# Patient Record
Sex: Female | Born: 1950 | Race: Black or African American | Hispanic: No | Marital: Single | Smoking: Never smoker
Health system: Southern US, Community
[De-identification: ages and names within clinical notes are randomized; demographics above are authoritative.]

## PROBLEM LIST (undated history)

## (undated) DIAGNOSIS — R112 Nausea with vomiting, unspecified: Secondary | ICD-10-CM

## (undated) DIAGNOSIS — Z9889 Other specified postprocedural states: Secondary | ICD-10-CM

## (undated) DIAGNOSIS — F40240 Claustrophobia: Secondary | ICD-10-CM

## (undated) DIAGNOSIS — M1990 Unspecified osteoarthritis, unspecified site: Secondary | ICD-10-CM

## (undated) DIAGNOSIS — J45909 Unspecified asthma, uncomplicated: Secondary | ICD-10-CM

## (undated) DIAGNOSIS — I10 Essential (primary) hypertension: Secondary | ICD-10-CM

## (undated) DIAGNOSIS — G4730 Sleep apnea, unspecified: Secondary | ICD-10-CM

## (undated) HISTORY — DX: Essential (primary) hypertension: I10

## (undated) HISTORY — DX: Unspecified osteoarthritis, unspecified site: M19.90

## (undated) HISTORY — PX: JOINT REPLACEMENT: SHX530

## (undated) HISTORY — PX: LAPAROSCOPIC GASTRIC BANDING: SHX1100

## (undated) HISTORY — DX: Unspecified asthma, uncomplicated: J45.909

## (undated) HISTORY — PX: ABDOMINAL HYSTERECTOMY: SHX81

---

## 2004-11-03 ENCOUNTER — Other Ambulatory Visit: Admission: RE | Admit: 2004-11-03 | Discharge: 2004-11-03 | Payer: Self-pay | Admitting: Obstetrics and Gynecology

## 2005-05-29 ENCOUNTER — Ambulatory Visit (HOSPITAL_COMMUNITY): Admission: RE | Admit: 2005-05-29 | Discharge: 2005-05-29 | Payer: Self-pay | Admitting: Gastroenterology

## 2005-11-10 ENCOUNTER — Other Ambulatory Visit: Admission: RE | Admit: 2005-11-10 | Discharge: 2005-11-10 | Payer: Self-pay | Admitting: Obstetrics and Gynecology

## 2005-11-27 ENCOUNTER — Encounter: Admission: RE | Admit: 2005-11-27 | Discharge: 2005-11-27 | Payer: Self-pay | Admitting: Obstetrics and Gynecology

## 2005-11-28 ENCOUNTER — Ambulatory Visit (HOSPITAL_BASED_OUTPATIENT_CLINIC_OR_DEPARTMENT_OTHER): Admission: RE | Admit: 2005-11-28 | Discharge: 2005-11-28 | Payer: Self-pay | Admitting: *Deleted

## 2005-12-06 ENCOUNTER — Ambulatory Visit: Payer: Self-pay | Admitting: Internal Medicine

## 2006-12-24 ENCOUNTER — Encounter: Admission: RE | Admit: 2006-12-24 | Discharge: 2006-12-24 | Payer: Self-pay | Admitting: *Deleted

## 2012-10-17 ENCOUNTER — Ambulatory Visit (INDEPENDENT_AMBULATORY_CARE_PROVIDER_SITE_OTHER): Payer: BC Managed Care – PPO | Admitting: Surgery

## 2012-10-17 ENCOUNTER — Other Ambulatory Visit (INDEPENDENT_AMBULATORY_CARE_PROVIDER_SITE_OTHER): Payer: Self-pay | Admitting: General Surgery

## 2012-10-17 ENCOUNTER — Encounter (INDEPENDENT_AMBULATORY_CARE_PROVIDER_SITE_OTHER): Payer: Self-pay | Admitting: Surgery

## 2012-10-17 VITALS — BP 132/64 | HR 72 | Temp 97.2°F | Resp 16 | Ht 68.0 in | Wt 250.8 lb

## 2012-10-17 DIAGNOSIS — I10 Essential (primary) hypertension: Secondary | ICD-10-CM | POA: Insufficient documentation

## 2012-10-17 DIAGNOSIS — Z9884 Bariatric surgery status: Secondary | ICD-10-CM

## 2012-10-17 DIAGNOSIS — Z6838 Body mass index (BMI) 38.0-38.9, adult: Secondary | ICD-10-CM

## 2012-10-17 NOTE — Progress Notes (Signed)
Transfer of care note.  [name] moved here from [facility] and had a lap band APS system in August of 2010. Since that time she has lost 50 pounds. At times according for nodes she has been too tight and I think may have developed some maladaptive  eating problems.  I have talked to her about using a small fork and small plate to slow down her eating rate and diminish her quantity of intake.  I would like for her to see Huntley Dec Himmelrich for a dietary consult.   She would like to be below 200lbs and I think that that is within her reach.    Return 6 weeks

## 2012-10-17 NOTE — Patient Instructions (Addendum)
Gastric Banding Surgery Care After Refer to this sheet in the next few weeks. These discharge instructions provide you with general information on caring for yourself after you leave the hospital. Your caregiver may also give you specific instructions. Your treatment has been planned according to the most current medical practices available, but unavoidable complications sometimes occur. If you have any problems or questions after discharge, call your caregiver. HOME CARE INSTRUCTIONS  Activity  Take frequent walks throughout the day. This will help to prevent blood clots. Do not sit for longer than 45 minutes to 1 hour while awake for 4 to 6 weeks after surgery.  Continue to do coughing and deep breathing exercises once you get home. This will help to prevent pneumonia.  Do not do strenuous activities, such as heavy lifting, pushing, or pulling, until after your follow-up visit with your caregiver. Do not lift anything heavier than 10 lb (4.5 kg).  Talk with your caregiver about when you may return to work and your exercise routine.  Do not drive while taking prescription pain medicine. Nutrition  It is very important that you drink at least 80 oz (2,400 mL) of fluid a day.  You should stay on a clear liquid diet until your follow-up visit with your caregiver. Keep sugar-free, clear liquid items on hand, including:  Tea: hot or cold. Drink only decaffeinated for the first month.  Broths: clear beef, chicken, vegetable.  Others: water, sugar-free frozen ice pops, flavored water, gelatin (after 1 week).  Do not consume caffeine for 1 month. Large amounts of caffeine can cause dehydration.  A dietician may also give you specific instructions.  Follow your caregiver's recommendations about vitamins and protein requirements after surgery. Hygiene  You may shower and wash your hair 2 days after surgery. Pat incisions dry. Do not rub incisions with a washcloth or towel.  Follow your  caregiver's recommendations about baths and pools following surgery. Pain control  If a prescription medicine was given, follow your caregiver's directions.  You may feel some gas pain caused by the carbon dioxide used to inflate your abdomen during surgery. This pain can be felt in your chest, shoulder, back, or abdominal area. Moving around often is advised. Incision care You may have 4 or more small incisions. They are closed with skin adhesive strips and have a clear plastic covering over them. You may remove your dressings the number of days directed by your caregiver after surgery. Check your incisions and surrounding area daily for any redness, swelling, discoloration, fluid (drainage), or bleeding. Dark red, dried blood may appear under these coverings. This is normal. SEEK MEDICAL CARE IF:   You develop persistent nausea and vomiting.  You have pain and discomfort with swallowing.  You have pain, swelling, or warmth in the lower extremities.  You have an oral temperature above 102 F (38.9 C).  You develop chills.  Your incision sites look red, swollen, or have drainage.  Your stool is black, tarry, or maroon in color.  You are lightheaded when standing.  You have any questions or concerns. SEEK IMMEDIATE MEDICAL CARE IF:   You have chest pain.  You have severe calf pain or pain not relieved by medicine.  You develop shortness of breath or difficulty breathing.  You feel confused.  You have slurred speech.  You suddenly feel weak. MAKE SURE YOU:   Understand these instructions.  Will watch your condition.  Will get help right away if you are not doing well or get   worse. Document Released: 07/21/2004 Document Revised: 02/29/2012 Document Reviewed: 04/29/2010 ExitCare Patient Information 2013 [employer].  

## 2012-10-18 ENCOUNTER — Encounter: Payer: Self-pay | Admitting: *Deleted

## 2012-10-18 ENCOUNTER — Encounter: Payer: BC Managed Care – PPO | Attending: Surgery | Admitting: *Deleted

## 2012-10-18 VITALS — Ht 68.0 in | Wt 252.0 lb

## 2012-10-18 DIAGNOSIS — Z9884 Bariatric surgery status: Secondary | ICD-10-CM | POA: Insufficient documentation

## 2012-10-18 DIAGNOSIS — Z09 Encounter for follow-up examination after completed treatment for conditions other than malignant neoplasm: Secondary | ICD-10-CM | POA: Insufficient documentation

## 2012-10-18 DIAGNOSIS — E669 Obesity, unspecified: Secondary | ICD-10-CM

## 2012-10-18 DIAGNOSIS — Z713 Dietary counseling and surveillance: Secondary | ICD-10-CM | POA: Insufficient documentation

## 2012-10-18 NOTE — Patient Instructions (Addendum)
Goals:  Follow up with Dr. Daphine Deutscher regarding band fill    Choose high protein foods and non-starchy vegetables  For now, AVOID fruit, starches (ex: rice, pasta, breads), and starchy vegetables  Eat 3-6 small meals/snacks, every 3-5 hrs  Try protein shake at breakfast - [place] in coffee, Premier Protein shake, etc  Increase lean protein foods to meet 60-80g goal  Increase fluid intake to 64oz +  Avoid drinking 15 minutes before, during and 30 minutes after eating  Aim for >30 min of physical activity daily   Join [employer] and start water aerobics/water walking at least 3 days/week; gradually increase

## 2012-10-18 NOTE — Progress Notes (Addendum)
  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  Primary concerns today: Post-operative Bariatric Surgery Nutrition Management. [name] comes today to establish post-op LAGB care. She had surg in 07/2009 up in [place] and recently moved to [facility] (08/21/12). Reports a lowest weight of 240 lbs (01/13).  Has issues with getting to the green zone; she is either in the red or yellow. When fluid added, she regurgitates and when removed, she feels she has little restriction. The frustration has caused her to turn to comfort foods like rice cakes, etc. Is also not exercising or taking supplements consistently. Low protein intake noted as well.  Recommend she receive a fill at next visit while we work on changing eating habits. Needs lab work done to R/O any nutritional deficiencies.   Surgery date:  07/2009 Patient reported start weight:  301 lbs  Weight today: 252.0 lbs Weight change: 49.0 lbs Total weight lost: 49.0 lbs BMI: 38.3 kg/m^2  TANITA  BODY COMP RESULTS  10/18/12   %Fat 52.5%   Fat Mass (lbs) 132.5   Fat Free Mass (lbs) 119.5   Total Body Water (lbs) 87.5   Dietary Intake:  Reports she SKIPS breakfast daily; has only coffee with liquid creamer.  Eating "comfort foods" including rice cakes. Also eats egg, tuna, or chicken salad in cafeteria at work; likely made with high fat mayo. Cannot tolerate bread, fruit, and some meats. Recently started back with Premier and Atkins protein shakes/bars.    Fluid intake:  40-50 oz Estimated total protein intake: <60g  Medications: HCTZ, Lisinopril Supplementation:  Only takes MVI when she can remember;  No calcium at this time. Gave several samples and handout with info.   Using straws: No Drinking while eating: No Hair loss: No Carbonated beverages: No N/V/D/C:  Regurgitation and burping before last fluid removal on 7/13.  Last Lap-Band fill:  None since moved to [place]  Recent  physical activity:  None at this time. Discussed joining [employer] and doing water aerobics, which has worked well for her in the past.   Progress Towards Goal(s):  In progress.  Handouts given during visit include:  Bariatric Surgery Protein Shakes  [facility] Price List  Vitamin/Mineral Supplementation handout  [facility] Meal Planning Card (yellow)  Bariatric Surgery Non-Starchy Vegetable List   Nutritional Diagnosis:  [place]-3.4 Unintentional weight gain related to poor food choices after partial band fluid removal evidenced by patient reported intake of high carb comfort foods and a weight gain of ~10 lbs.    Intervention:  Nutrition education/reinforcement.  Monitoring/Evaluation:  Dietary intake, exercise, lap band fills, and body weight. Follow up in 1 month.

## 2012-10-20 ENCOUNTER — Ambulatory Visit: Payer: Self-pay | Admitting: *Deleted

## 2012-11-09 ENCOUNTER — Ambulatory Visit: Payer: Self-pay | Admitting: *Deleted

## 2012-11-14 ENCOUNTER — Encounter: Payer: BC Managed Care – PPO | Attending: Surgery | Admitting: *Deleted

## 2012-11-14 ENCOUNTER — Encounter: Payer: Self-pay | Admitting: *Deleted

## 2012-11-14 VITALS — Ht 68.0 in | Wt 254.5 lb

## 2012-11-14 DIAGNOSIS — Z09 Encounter for follow-up examination after completed treatment for conditions other than malignant neoplasm: Secondary | ICD-10-CM | POA: Insufficient documentation

## 2012-11-14 DIAGNOSIS — E669 Obesity, unspecified: Secondary | ICD-10-CM

## 2012-11-14 DIAGNOSIS — Z713 Dietary counseling and surveillance: Secondary | ICD-10-CM | POA: Insufficient documentation

## 2012-11-14 DIAGNOSIS — Z9884 Bariatric surgery status: Secondary | ICD-10-CM | POA: Insufficient documentation

## 2012-11-14 NOTE — Progress Notes (Signed)
  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  Primary concerns today: Post-operative Bariatric Surgery Nutrition Management. [name] returns today for f/u. Has gained 2 lbs of fluid (see Tanita data below) and maintained wt despite large increase in exercise and d/c of meal skipping. Reports she is "hungry all the time", though protein intake looks good. Fat content is high on varied days via excessive intake of nuts. No longer eating rice cakes and other comfort foods. [name] is likely in need of a band fill, but defer to Dr. Ross Marcus. Advised her to contact [facility] for appt.     Surgery date:  07/2009 Patient reported start weight:  301 lbs  Weight today: 254.5 lbs Weight change: 2.5 lb GAIN Total weight lost: 46.5 lbs BMI: 38.7 kg/m^2  TANITA  BODY COMP RESULTS  10/18/12 11/14/12   %Fat 52.5% 52.1%   Fat Mass (lbs) 132.5 132.5   Fat Free Mass (lbs) 119.5 122.0   Total Body Water (lbs) 87.5 89.5   Dietary Intake:   *Coffee  B: Egg and pc of ham, Premier protein bar, OR shake S: Atkins bar L: 1-2 oz [place] & cheese, 1/2 pc bacon, premier protein bar S: 2-3 oz nuts (varies from 1-3 oz; not every day) D: 1.5 cup of soup w/ 2 oz ground [place] and vegetables OR 3-4 oz baked breaded fish S: 2-3 oz nuts  Fluid intake:  1x/wk 1/2 sweet/unsweet tea, water 11 oz shake, 1-3 (32 oz) cups water Estimated total protein intake:   Medications: HCTZ, Lisinopril Supplementation:   Taking MVI and calcium regularly  Using straws: No Drinking while eating: No Hair loss: No Carbonated beverages: No N/V/D/C:  None Last Lap-Band fill:  None since moved to [place] - previous MD removed fluid in 06/2012; unsure how much is in her band  Recent physical activity:  Rides recumbent bike 4-5 times/week for 30-45 min since last visit.   Progress Towards Goal(s):  In progress.   Nutritional Diagnosis:  [place]-3.4 Unintentional  weight gain related to poor food choices after partial band fluid removal evidenced by patient reported intake of high carb comfort foods and a weight gain of ~10 lbs.    Intervention:  Nutrition education/reinforcement.  Monitoring/Evaluation:  Dietary intake, exercise, lap band fills, and body weight. Follow up after band fill.

## 2012-11-14 NOTE — Patient Instructions (Addendum)
Goals:  Follow up with Dr. Ross Marcus regarding band fill     Choose high protein foods and non-starchy vegetables  For now, AVOID fruit, starches (ex: rice, pasta, breads), and starchy vegetables  Increase lean protein foods to meet 60-80g goal - aim for 2-3 oz protein per meal  Increase fluid intake to 64oz +  Aim for >30 min of physical activity daily   Watch portions of nuts - limit to 1-2 oz daily (High fat content)  TANITA  BODY COMP RESULTS  10/18/12 11/14/12   %Fat 52.5% 52.1%   Fat Mass (lbs) 132.5 132.5   Fat Free Mass (lbs) 119.5 122.0   Total Body Water (lbs) 87.5 89.5

## 2012-12-02 ENCOUNTER — Ambulatory Visit (INDEPENDENT_AMBULATORY_CARE_PROVIDER_SITE_OTHER): Payer: BC Managed Care – PPO | Admitting: Surgery

## 2012-12-02 ENCOUNTER — Encounter (INDEPENDENT_AMBULATORY_CARE_PROVIDER_SITE_OTHER): Payer: Self-pay | Admitting: Surgery

## 2012-12-02 VITALS — BP 142/70 | HR 68 | Temp 97.6°F | Resp 12 | Ht 68.0 in | Wt 254.0 lb

## 2012-12-02 DIAGNOSIS — Z9884 Bariatric surgery status: Secondary | ICD-10-CM

## 2012-12-02 NOTE — Progress Notes (Signed)
Tonette Bihari Body mass index is 38.62 kg/(m^2).  Having regurgitation:  no  Nocturnal reflux?  no  Amount of fill  0.5 Her port is on the left side and I was able to palpated although it is very deep. Access this or lateral. I accessed it on the first stick and added 0.5 cc to her band. I want him to add incrementally. She was concerned about large fills and in the past they've had difficulty we accessing her port. I plan to access her and had incrementally. I'll see her again in 4 weeks to consider her for another band fill. She's currently recovering from gum surgery and is on a liquid diet anyway. I encouraged her stay on a full liquid diet including protein shakes. Return in 4 weeks

## 2012-12-02 NOTE — Patient Instructions (Signed)

## 2013-01-04 ENCOUNTER — Encounter (INDEPENDENT_AMBULATORY_CARE_PROVIDER_SITE_OTHER): Payer: Self-pay | Admitting: Surgery

## 2013-01-04 ENCOUNTER — Ambulatory Visit (INDEPENDENT_AMBULATORY_CARE_PROVIDER_SITE_OTHER): Payer: BC Managed Care – PPO | Admitting: Surgery

## 2013-01-04 VITALS — BP 124/84 | HR 72 | Temp 97.4°F | Resp 12 | Ht 68.0 in | Wt 246.2 lb

## 2013-01-04 DIAGNOSIS — Z9884 Bariatric surgery status: Secondary | ICD-10-CM

## 2013-01-04 NOTE — Patient Instructions (Addendum)

## 2013-01-04 NOTE — Progress Notes (Signed)
[name] Body mass index is 37.43 kg/(m^2).  Having regurgitation:  Not really but when she drinks shake too fast she burps  Nocturnal reflux?  no  Amount of fill  None She says that she is tight especially when she drinks her protein shakes. I think she is learning portion control. She is not is hungry she was when she saw Huntley Dec.  She wants to hold off her band on them but does want some lab checked. We will go and check a bariatric panel on her. We will place her on the schedule to be seen again in 3 months  Today his weight is 246 which is down almost 8 pounds since her last visit on 12/02/12.

## 2013-01-09 LAB — COMPREHENSIVE METABOLIC PANEL
AST: 21 U/L (ref 0–37)
Albumin: 4.2 g/dL (ref 3.5–5.2)
Alkaline Phosphatase: 86 U/L (ref 39–117)
BUN: 17 mg/dL (ref 6–23)
Calcium: 10 mg/dL (ref 8.4–10.5)
Chloride: 104 mEq/L (ref 96–112)
Creat: 0.74 mg/dL (ref 0.50–1.10)
Glucose, Bld: 93 mg/dL (ref 70–99)
Potassium: 3.9 mEq/L (ref 3.5–5.3)

## 2013-01-09 LAB — CBC WITH DIFFERENTIAL/PLATELET
Basophils Absolute: 0 10*3/uL (ref 0.0–0.1)
Basophils Relative: 0 % (ref 0–1)
Eosinophils Relative: 2 % (ref 0–5)
HCT: 37.1 % (ref 36.0–46.0)
Hemoglobin: 12.6 g/dL (ref 12.0–15.0)
MCH: 30.4 pg (ref 26.0–34.0)
MCHC: 34 g/dL (ref 30.0–36.0)
MCV: 89.4 fL (ref 78.0–100.0)
Monocytes Absolute: 0.3 10*3/uL (ref 0.1–1.0)
Monocytes Relative: 5 % (ref 3–12)
Neutro Abs: 3.1 10*3/uL (ref 1.7–7.7)
RDW: 15.3 % (ref 11.5–15.5)

## 2013-01-09 LAB — LIPID PANEL
Cholesterol: 204 mg/dL — ABNORMAL HIGH (ref 0–200)
HDL: 44 mg/dL (ref >39)
LDL Cholesterol: 137 mg/dL — ABNORMAL HIGH (ref 0–99)
Triglycerides: 115 mg/dL (ref <150)

## 2013-01-09 LAB — IBC PANEL
%SAT: 32 % (ref 20–55)
TIBC: 314 ug/dL (ref 250–470)
UIBC: 215 ug/dL (ref 125–400)

## 2013-01-10 LAB — VITAMIN D 25 HYDROXY (VIT D DEFICIENCY, FRACTURES): Vit D, 25-Hydroxy: 48 ng/mL (ref 30–89)

## 2013-01-10 LAB — H. PYLORI ANTIBODY, IGG: H Pylori IgG: 1.17 {ISR} — ABNORMAL HIGH

## 2013-01-12 ENCOUNTER — Ambulatory Visit
Admission: RE | Admit: 2013-01-12 | Discharge: 2013-01-12 | Disposition: A | Discharge status: Home / Self Care | Payer: BC Managed Care – PPO | Source: Ambulatory Visit | Attending: Physician Assistant | Admitting: Physician Assistant

## 2013-01-12 ENCOUNTER — Encounter (INDEPENDENT_AMBULATORY_CARE_PROVIDER_SITE_OTHER): Payer: Self-pay | Admitting: Physician Assistant

## 2013-01-12 ENCOUNTER — Telehealth (INDEPENDENT_AMBULATORY_CARE_PROVIDER_SITE_OTHER): Payer: Self-pay | Admitting: Physician Assistant

## 2013-01-12 ENCOUNTER — Ambulatory Visit (INDEPENDENT_AMBULATORY_CARE_PROVIDER_SITE_OTHER): Payer: BC Managed Care – PPO | Admitting: Physician Assistant

## 2013-01-12 VITALS — BP 122/82 | HR 79 | Temp 96.8°F | Resp 16 | Ht 68.0 in | Wt 248.0 lb

## 2013-01-12 DIAGNOSIS — Z4651 Encounter for fitting and adjustment of gastric lap band: Secondary | ICD-10-CM

## 2013-01-12 NOTE — Progress Notes (Signed)
  HISTORY: [name] is a 62 y.o.female who received an AP-Standard lap-band in 2010 in [place]. She was seen by Dr. Daphine Deutscher last week with no adjustment. She had some complaints of burping but no persistent dysphagia. Now she has significant burping and solid food dysphagia.  VITAL SIGNS: Filed Vitals:   01/12/13 1257  BP: 122/82  Pulse: 79  Temp: 96.8 F (36 C)  Resp: 16    PHYSICAL EXAM: Physical exam reveals a very well-appearing 62 y.o.female in no apparent distress Neurologic: Awake, alert, oriented Psych: Bright affect, conversant Respiratory: Breathing even and unlabored. No stridor or wheezing Abdomen: Soft, nontender, nondistended to palpation. Incisions well-healed. No incisional hernias. Port easily palpated. Extremities: Atraumatic, good range of motion.  ASSESMENT: 62 y.o.  female  s/p AP-Standard lap-band.   PLAN: The patient's port was accessed with a 20G Huber needle without difficulty. Clear fluid was aspirated and 0.5 mL saline was removed from the port. The patient was able to swallow water without difficulty following the procedure and was instructed to take clear liquids for the next 24-48 hours and advance slowly as tolerated. I've ordered a KUB as well.

## 2013-01-12 NOTE — Patient Instructions (Addendum)
Get your xray today. We will call you with the results and follow-up instructions.

## 2013-01-12 NOTE — Telephone Encounter (Signed)
Called Re: KUB results today. Left message that 'test results looked great'. Told patient to expect a call from [place] for an appointment in February. Asked pt to call if any questions.

## 2013-01-13 ENCOUNTER — Telehealth (INDEPENDENT_AMBULATORY_CARE_PROVIDER_SITE_OTHER): Payer: Self-pay | Admitting: General Surgery

## 2013-01-13 NOTE — Telephone Encounter (Signed)
LMOM to set up lap band appt in Feb per [place]...ask patient to return my call

## 2013-02-16 ENCOUNTER — Ambulatory Visit (INDEPENDENT_AMBULATORY_CARE_PROVIDER_SITE_OTHER): Payer: BC Managed Care – PPO | Admitting: Physician Assistant

## 2013-02-16 ENCOUNTER — Encounter (INDEPENDENT_AMBULATORY_CARE_PROVIDER_SITE_OTHER): Payer: Self-pay

## 2013-02-16 VITALS — BP 142/90 | HR 84 | Temp 97.6°F | Resp 16 | Ht 68.0 in | Wt 256.8 lb

## 2013-02-16 DIAGNOSIS — Z4651 Encounter for fitting and adjustment of gastric lap band: Secondary | ICD-10-CM

## 2013-02-16 NOTE — Patient Instructions (Signed)
Take clear liquids tonight. Thin protein shakes are ok to start tomorrow morning. Slowly advance your diet thereafter. Call us if you have persistent vomiting or regurgitation, night cough or reflux symptoms. Return as scheduled or sooner if you notice no changes in hunger/portion sizes.  

## 2013-02-16 NOTE — Progress Notes (Signed)
  HISTORY: [name] is a 62 y.o.female who received an AP-Standard lap-band in August 2010. She comes in with 10 lbs weight gain and increased hunger and portion sizes since fluid was removed for solid food dysphagia and burping in January. She denies further issues with burping or regurgitation and she would like to have a fill.  VITAL SIGNS: Filed Vitals:   02/16/13 1545  BP: 142/90  Pulse: 84  Temp: 97.6 F (36.4 C)  Resp: 16    PHYSICAL EXAM: Physical exam reveals a very well-appearing 62 y.o.female in no apparent distress Neurologic: Awake, alert, oriented Psych: Bright affect, conversant Respiratory: Breathing even and unlabored. No stridor or wheezing Abdomen: Soft, nontender, nondistended to palpation. Incisions well-healed. No incisional hernias. Port easily palpated. Extremities: Atraumatic, good range of motion.  ASSESMENT: 62 y.o.  female  s/p AP-Standard  lap-band.   PLAN: The patient's port was accessed with a 20G Huber needle without difficulty. Clear fluid was aspirated and 0.25 mL saline was added to the port. The patient was able to swallow water without difficulty following the procedure and was instructed to take clear liquids for the next 24-48 hours and advance slowly as tolerated.

## 2013-02-21 ENCOUNTER — Telehealth (INDEPENDENT_AMBULATORY_CARE_PROVIDER_SITE_OTHER): Payer: Self-pay | Admitting: General Surgery

## 2013-02-21 NOTE — Telephone Encounter (Signed)
Pt called to report she had a lap band fill last week and cannot tell any difference.  She is "drinking tons" of fluid, but remains hungry.  She would like to come back in on Thursday for more fill.  She is concerned about having to make another co-pay so soon.  Pt understands that the charges for the visit will be at the PAs discretion.  Appt made.

## 2013-02-23 ENCOUNTER — Encounter (INDEPENDENT_AMBULATORY_CARE_PROVIDER_SITE_OTHER): Payer: BC Managed Care – PPO

## 2013-03-16 ENCOUNTER — Ambulatory Visit (INDEPENDENT_AMBULATORY_CARE_PROVIDER_SITE_OTHER): Payer: BC Managed Care – PPO | Admitting: Physician Assistant

## 2013-03-16 ENCOUNTER — Encounter (INDEPENDENT_AMBULATORY_CARE_PROVIDER_SITE_OTHER): Payer: Self-pay

## 2013-03-16 ENCOUNTER — Encounter (INDEPENDENT_AMBULATORY_CARE_PROVIDER_SITE_OTHER): Payer: BC Managed Care – PPO

## 2013-03-16 VITALS — BP 132/72 | HR 88 | Temp 97.7°F | Resp 16 | Ht 68.0 in | Wt 259.2 lb

## 2013-03-16 DIAGNOSIS — Z4651 Encounter for fitting and adjustment of gastric lap band: Secondary | ICD-10-CM

## 2013-03-16 NOTE — Progress Notes (Signed)
  HISTORY: [name] is a 62 y.o.female who received lap-band in August 2010. She comes in with continued hunger, weight gain and larger portion sizes despite a fill one month ago. She would like a fill today. She's has no complaints of regurgitation or reflux.  VITAL SIGNS: Filed Vitals:   03/16/13 1421  BP: 132/72  Pulse: 88  Temp: 97.7 F (36.5 C)  Resp: 16    PHYSICAL EXAM: Physical exam reveals a very well-appearing 62 y.o.female in no apparent distress Neurologic: Awake, alert, oriented Psych: Bright affect, conversant Respiratory: Breathing even and unlabored. No stridor or wheezing Abdomen: Soft, nontender, nondistended to palpation. Incisions well-healed. No incisional hernias. Port easily palpated. Extremities: Atraumatic, good range of motion.  ASSESMENT: 62 y.o.  female  s/p lap-band.   PLAN: The patient's port was accessed with a 20G Huber needle without difficulty. Clear fluid was aspirated and 0.25 mL saline was added to the port. The patient was able to swallow water without difficulty following the procedure and was instructed to take clear liquids for the next 24-48 hours and advance slowly as tolerated.

## 2013-03-16 NOTE — Patient Instructions (Signed)
Take clear liquids tonight. Thin protein shakes are ok to start tomorrow morning. Slowly advance your diet thereafter. Call us if you have persistent vomiting or regurgitation, night cough or reflux symptoms. Return as scheduled or sooner if you notice no changes in hunger/portion sizes.  

## 2013-04-13 ENCOUNTER — Ambulatory Visit (INDEPENDENT_AMBULATORY_CARE_PROVIDER_SITE_OTHER): Payer: BC Managed Care – PPO | Admitting: Physician Assistant

## 2013-04-13 ENCOUNTER — Encounter (INDEPENDENT_AMBULATORY_CARE_PROVIDER_SITE_OTHER): Payer: Self-pay

## 2013-04-13 DIAGNOSIS — Z9884 Bariatric surgery status: Secondary | ICD-10-CM

## 2013-04-13 NOTE — Patient Instructions (Signed)
Return in June. Focus on good food choices as well as physical activity. Return sooner if you have an increase in hunger, portion sizes or weight. Return also for difficulty swallowing, night cough, reflux.

## 2013-04-13 NOTE — Progress Notes (Signed)
  HISTORY: [name] is a 62 y.o.female who received an AP-Standard lap-band in August 2010 in [place]. She comes in with 3 lbs weight loss. She describes hunger and poor satiety, mostly because of her primarily soft-food diet. She is expecting her partial denture to be fitted in one week, which will finally allow her to chew adequately. She's not capable of taking good proteins due to her dental issue.  VITAL SIGNS: Filed Vitals:   04/13/13 1515  BP: 130/76  Pulse: 80  Resp: 18    PHYSICAL EXAM: Physical exam reveals a very well-appearing 62 y.o.female in no apparent distress Neurologic: Awake, alert, oriented Psych: Bright affect, conversant Respiratory: Breathing even and unlabored. No stridor or wheezing Extremities: Atraumatic, good range of motion. Skin: Warm, Dry, no rashes Musculoskeletal: Normal gait, Joints normal  ASSESMENT: 62 y.o.  female  s/p AP-Standard lap-band.   PLAN: As she's taking soft food now and she's had issues with over-restriction in the past, we decided to defer an adjustment until she is again taking more solid food. We'll have her back in about six weeks.

## 2013-05-25 ENCOUNTER — Encounter (INDEPENDENT_AMBULATORY_CARE_PROVIDER_SITE_OTHER): Payer: Self-pay

## 2013-05-25 ENCOUNTER — Ambulatory Visit (INDEPENDENT_AMBULATORY_CARE_PROVIDER_SITE_OTHER): Payer: BC Managed Care – PPO | Admitting: Physician Assistant

## 2013-05-25 VITALS — BP 132/84 | HR 81 | Temp 98.3°F | Resp 18 | Ht 68.0 in | Wt 264.0 lb

## 2013-05-25 DIAGNOSIS — Z4651 Encounter for fitting and adjustment of gastric lap band: Secondary | ICD-10-CM

## 2013-05-25 NOTE — Progress Notes (Signed)
  HISTORY: [name] is a 62 y.o.female who received an AP-Standard lap-band in August 2010 in [place]. She comes in with 8 lbs weight gain since her last visit. She is having significant hunger and larger portion sizes. She denies regurgitation. She would like a fill today to get her weight back under control.  VITAL SIGNS: Filed Vitals:   05/25/13 1618  BP: 132/84  Pulse: 81  Temp: 98.3 F (36.8 C)  Resp: 18    PHYSICAL EXAM: Physical exam reveals a very well-appearing 62 y.o.female in no apparent distress Neurologic: Awake, alert, oriented Psych: Bright affect, conversant Respiratory: Breathing even and unlabored. No stridor or wheezing Abdomen: Soft, nontender, nondistended to palpation. Incisions well-healed. No incisional hernias. Port easily palpated. Extremities: Atraumatic, good range of motion.  ASSESMENT: 62 y.o.  female  s/p AP-Standard lap-band.   PLAN: The patient's port was accessed with a 20G Huber needle without difficulty. Clear fluid was aspirated and 0.4 mL saline was added to the port. The patient was able to swallow water without difficulty following the procedure and was instructed to take clear liquids for the next 24-48 hours and advance slowly as tolerated.

## 2013-05-25 NOTE — Patient Instructions (Signed)
Take clear liquids tonight. Thin protein shakes are ok to start tomorrow morning. Slowly advance your diet thereafter. Call us if you have persistent vomiting or regurgitation, night cough or reflux symptoms. Return as scheduled or sooner if you notice no changes in hunger/portion sizes.  

## 2013-06-20 ENCOUNTER — Other Ambulatory Visit: Payer: Self-pay

## 2013-06-20 DIAGNOSIS — Z1231 Encounter for screening mammogram for malignant neoplasm of breast: Secondary | ICD-10-CM

## 2013-06-29 ENCOUNTER — Encounter (INDEPENDENT_AMBULATORY_CARE_PROVIDER_SITE_OTHER): Payer: Self-pay

## 2013-06-29 ENCOUNTER — Ambulatory Visit (INDEPENDENT_AMBULATORY_CARE_PROVIDER_SITE_OTHER): Payer: BC Managed Care – PPO | Admitting: Physician Assistant

## 2013-06-29 DIAGNOSIS — Z4651 Encounter for fitting and adjustment of gastric lap band: Secondary | ICD-10-CM

## 2013-06-29 DIAGNOSIS — R142 Eructation: Secondary | ICD-10-CM

## 2013-06-29 NOTE — Patient Instructions (Signed)
Obtain your x-ray. Return in two weeks. Focus on good food choices as well as physical activity. Return sooner if you have an increase in hunger, portion sizes or weight. Return also for difficulty swallowing, night cough, reflux.

## 2013-06-29 NOTE — Progress Notes (Signed)
  HISTORY: [name] is a 62 y.o.female who received an AP-Standard lap-band in August 2010 in [place]. She comes in with recurrent excessive belching and solid food dysphagia. She reports even water going down slowly.  VITAL SIGNS: Filed Vitals:   06/29/13 1532  BP: 130/80  Pulse: 76  Resp: 16    PHYSICAL EXAM: Physical exam reveals a very well-appearing 62 y.o.female in no apparent distress. She is belching audibly to the point that it's interrupting conversation. Neurologic: Awake, alert, oriented Psych: Bright affect, conversant Respiratory: Breathing even and unlabored. No stridor or wheezing Abdomen: Soft, nontender, nondistended to palpation. Incisions well-healed. No incisional hernias. Port easily palpated. Extremities: Atraumatic, good range of motion.  ASSESMENT: 62 y.o.  female  s/p AP-Standard lap-band.   PLAN: The patient's port was accessed with a 20G Huber needle without difficulty. Clear fluid was aspirated and 1 mL saline was removed from the port. The patient was advised to concentrate on healthy food choices and to avoid slider foods high in fats and carbohydrates. I've ordered an upper GI which is scheduled for tomorrow. I've asked her to return in two weeks. Meantime, I asked her to have liquids only today then progress to solids tomorrow after her study.

## 2013-06-30 ENCOUNTER — Ambulatory Visit
Admission: RE | Admit: 2013-06-30 | Discharge: 2013-06-30 | Disposition: A | Discharge status: Home / Self Care | Payer: BC Managed Care – PPO | Source: Ambulatory Visit | Attending: Physician Assistant | Admitting: Physician Assistant

## 2013-06-30 DIAGNOSIS — R142 Eructation: Secondary | ICD-10-CM

## 2013-06-30 DIAGNOSIS — Z4651 Encounter for fitting and adjustment of gastric lap band: Secondary | ICD-10-CM

## 2013-07-07 ENCOUNTER — Ambulatory Visit
Admission: RE | Admit: 2013-07-07 | Discharge: 2013-07-07 | Disposition: A | Discharge status: Home / Self Care | Payer: BC Managed Care – PPO | Source: Ambulatory Visit

## 2013-07-07 DIAGNOSIS — Z1231 Encounter for screening mammogram for malignant neoplasm of breast: Secondary | ICD-10-CM

## 2013-07-13 ENCOUNTER — Encounter (INDEPENDENT_AMBULATORY_CARE_PROVIDER_SITE_OTHER): Payer: Self-pay

## 2013-07-13 ENCOUNTER — Ambulatory Visit (INDEPENDENT_AMBULATORY_CARE_PROVIDER_SITE_OTHER): Payer: BC Managed Care – PPO | Admitting: Physician Assistant

## 2013-07-13 DIAGNOSIS — Z4651 Encounter for fitting and adjustment of gastric lap band: Secondary | ICD-10-CM

## 2013-07-13 NOTE — Progress Notes (Signed)
  HISTORY: [name] is a 62 y.o.female who received an AP-Standard lap-band in August 2010 in [place]. She had 1 mL fluid removed two weeks ago for persistent belching and dysphagia. She had an upper GI the following day. The fluid removal resolved her symptoms but she now complains of hunger and larger than desired portions. The upper GI showed good transit of contrast and the barium tablet. She has lost 1 lb since fluid removal but she is concerned about the hunger and getting it under control.  VITAL SIGNS: Filed Vitals:   07/13/13 1525  BP: 132/80  Pulse: 60  Resp: 16    PHYSICAL EXAM: Physical exam reveals a very well-appearing 62 y.o.female in no apparent distress Neurologic: Awake, alert, oriented Psych: Bright affect, conversant Respiratory: Breathing even and unlabored. No stridor or wheezing Abdomen: Soft, nontender, nondistended to palpation. Incisions well-healed. No incisional hernias. Port easily palpated. Extremities: Atraumatic, good range of motion.  ASSESMENT: 62 y.o.  female  s/p AP-Standard lap-band.   PLAN: The patient's port was accessed with a 20G Huber needle without difficulty. Clear fluid was aspirated and 0.5 mL saline was added to the port. The patient was able to swallow water without difficulty following the procedure and was instructed to take clear liquids for the next 24-48 hours and advance slowly as tolerated. We discussed the need to help her hunger but to avoid filling the band to the point of causing obstructive symptoms and the belching she had. I encouraged her to strive for a high-protein, low-carb diet along with exercise. She voiced understanding and agreement. We'll have her return in 4-6 weeks.

## 2013-07-13 NOTE — Patient Instructions (Signed)

## 2013-08-04 ENCOUNTER — Emergency Department (HOSPITAL_COMMUNITY): Payer: BC Managed Care – PPO

## 2013-08-04 ENCOUNTER — Observation Stay (HOSPITAL_COMMUNITY)
Admission: EM | Admit: 2013-08-04 | Discharge: 2013-08-05 | Disposition: A | Discharge status: Home / Self Care | Payer: BC Managed Care – PPO | Attending: Internal Medicine | Admitting: Internal Medicine

## 2013-08-04 ENCOUNTER — Encounter (HOSPITAL_COMMUNITY): Payer: Self-pay | Admitting: Emergency Medicine

## 2013-08-04 DIAGNOSIS — Z79899 Other long term (current) drug therapy: Secondary | ICD-10-CM | POA: Insufficient documentation

## 2013-08-04 DIAGNOSIS — E785 Hyperlipidemia, unspecified: Secondary | ICD-10-CM | POA: Diagnosis present

## 2013-08-04 DIAGNOSIS — E669 Obesity, unspecified: Secondary | ICD-10-CM | POA: Insufficient documentation

## 2013-08-04 DIAGNOSIS — R079 Chest pain, unspecified: Principal | ICD-10-CM | POA: Insufficient documentation

## 2013-08-04 DIAGNOSIS — Z8249 Family history of ischemic heart disease and other diseases of the circulatory system: Secondary | ICD-10-CM | POA: Insufficient documentation

## 2013-08-04 DIAGNOSIS — I10 Essential (primary) hypertension: Secondary | ICD-10-CM | POA: Insufficient documentation

## 2013-08-04 HISTORY — DX: Other specified postprocedural states: Z98.890

## 2013-08-04 HISTORY — DX: Other specified postprocedural states: R11.2

## 2013-08-04 LAB — BASIC METABOLIC PANEL
BUN: 15 mg/dL (ref 6–23)
Calcium: 9.3 mg/dL (ref 8.4–10.5)
Creatinine, Ser: 0.67 mg/dL (ref 0.50–1.10)
GFR calc non Af Amer: >90 mL/min (ref >90)

## 2013-08-04 LAB — CBC
HCT: 39.2 % (ref 36.0–46.0)
MCH: 30 pg (ref 26.0–34.0)
MCHC: 33.2 g/dL (ref 30.0–36.0)
MCV: 90.5 fL (ref 78.0–100.0)
Platelets: 274 10*3/uL (ref 150–400)
RDW: 13.4 % (ref 11.5–15.5)

## 2013-08-04 LAB — TROPONIN I
Troponin I: <0.3 ng/mL (ref <0.30)
Troponin I: <0.3 ng/mL (ref <0.30)

## 2013-08-04 LAB — D-DIMER, QUANTITATIVE: D-Dimer, Quant: 1.86 ug/mL-FEU — ABNORMAL HIGH (ref 0.00–0.48)

## 2013-08-04 MED ORDER — ENOXAPARIN SODIUM 40 MG/0.4ML ~~LOC~~ SOLN
40.0000 mg | SUBCUTANEOUS | Status: DC
  Administered 2013-08-04: 40 mg via SUBCUTANEOUS
  Filled 2013-08-04 (×2): qty 0.4

## 2013-08-04 MED ORDER — IOHEXOL 350 MG/ML SOLN
100.0000 mL | Freq: Once | INTRAVENOUS | Status: AC | PRN
  Administered 2013-08-04: 100 mL via INTRAVENOUS

## 2013-08-04 MED ORDER — ASPIRIN EC 81 MG PO TBEC
81.0000 mg | DELAYED_RELEASE_TABLET | Freq: Every day | ORAL | Status: DC
  Filled 2013-08-04: qty 1

## 2013-08-04 MED ORDER — NITROGLYCERIN 2 % TD OINT
0.5000 [in_us] | TOPICAL_OINTMENT | Freq: Four times a day (QID) | TRANSDERMAL | Status: DC
  Administered 2013-08-04 (×2): 0.5 [in_us] via TOPICAL
  Filled 2013-08-04: qty 30

## 2013-08-04 MED ORDER — ASPIRIN EC 81 MG PO TBEC
81.0000 mg | DELAYED_RELEASE_TABLET | Freq: Every day | ORAL | Status: DC
  Administered 2013-08-05: 81 mg via ORAL
  Filled 2013-08-04: qty 1

## 2013-08-04 MED ORDER — HYDROCHLOROTHIAZIDE 25 MG PO TABS
25.0000 mg | ORAL_TABLET | Freq: Every evening | ORAL | Status: DC
  Administered 2013-08-04: 25 mg via ORAL
  Filled 2013-08-04 (×2): qty 1

## 2013-08-04 MED ORDER — TRAZODONE HCL 100 MG PO TABS
100.0000 mg | ORAL_TABLET | Freq: Every day | ORAL | Status: DC
  Administered 2013-08-04: 100 mg via ORAL
  Filled 2013-08-04 (×2): qty 1

## 2013-08-04 MED ORDER — ASPIRIN 81 MG PO CHEW
324.0000 mg | CHEWABLE_TABLET | Freq: Once | ORAL | Status: AC
  Administered 2013-08-04: 324 mg via ORAL
  Filled 2013-08-04: qty 4

## 2013-08-04 MED ORDER — LISINOPRIL 20 MG PO TABS
30.0000 mg | ORAL_TABLET | Freq: Every evening | ORAL | Status: DC
  Administered 2013-08-04: 30 mg via ORAL
  Filled 2013-08-04 (×2): qty 1

## 2013-08-04 MED ORDER — SODIUM CHLORIDE 0.9 % IJ SOLN
3.0000 mL | Freq: Two times a day (BID) | INTRAMUSCULAR | Status: DC
  Administered 2013-08-04: 3 mL via INTRAVENOUS

## 2013-08-04 NOTE — ED Provider Notes (Signed)
CSN: [id number]     Arrival date & time 08/04/13  1236 History     First MD Initiated Contact with Patient 08/04/13 1255     Chief Complaint  Patient presents with  . Shortness of Breath   HPI  [name] is a 62 year old female with a PMH of HTN, HLD, asthma, and arthritis who presents to the ED for evaluation of SOB.  Patient states she has been having intermittent SOB and generalized weakness for the past week.  She states that her SOB is worse with exertion.  She also has SOB at rest.  Today at work around 9:00 am she developed a chest pressure diffusely across her chest and numbness and tingling in her left arm.  Her pressure is constant and currently present in the ED.  Nothing makes her symptoms better or worse.  She has never had similar symptoms in the past.  She also states she felt lightheaded with no syncope.  No lightheadedness currently.  She denies any heart history in the past.  No cocaine or tobacco use.  She states she had a normal stress test a few years ago.  She has had decreased appetite for the past week as well as fatigue.  No fever, chills, rhinorrhea, congestion, cough, abdominal pain, nausea, emesis, diarrhea, constipation, dysuria, or leg edema.  No FM or personal hx of clotting disorders/DVT. No recent travel or surgeries.  FH of cardiac disease.  Mom had a MI at age 45.     Past Medical History  Diagnosis Date  . Arthritis   . Hypertension   . Asthma    Past Surgical History  Procedure Laterality Date  . Abdominal hysterectomy    . Joint replacement      both knees  . Laparoscopic gastric banding     Family History  Problem Relation Age of Onset  . Stroke Mother   . Heart disease Mother   . Cancer Father     prostate  . Cancer Sister     colon   History  Substance Use Topics  . Smoking status: Never Smoker   . Smokeless tobacco: Never Used  . Alcohol Use: No   OB History   Grav Para Term Preterm Abortions TAB SAB Ect Mult Living                  Review of Systems  Constitutional: Positive for appetite change (decreased) and fatigue. Negative for fever, chills, diaphoresis and activity change.  HENT: Negative for ear pain, congestion, sore throat, neck pain and neck stiffness.   Eyes: Negative for visual disturbance.  Respiratory: Positive for shortness of breath. Negative for cough and wheezing.   Cardiovascular: Positive for chest pain (pressure). Negative for palpitations and leg swelling.  Gastrointestinal: Negative for nausea, vomiting, abdominal pain, diarrhea and constipation.  Genitourinary: Negative for dysuria.  Musculoskeletal: Negative for back pain.  Skin: Negative for rash and wound.  Neurological: Positive for weakness (generalized) and light-headedness. Negative for syncope and headaches.    Allergies  Morphine and related and Lipitor  Home Medications   Current Outpatient Rx  Name  Route  Sig  Dispense  Refill  . Calcium Carbonate-Vitamin D (CALCIUM 600 + D PO)   Oral   Take 2 tablets by mouth at bedtime.         . hydrochlorothiazide (HYDRODIURIL) 25 MG tablet   Oral   Take 25 mg by mouth every evening.          Marland Kitchen  lisinopril (PRINIVIL,ZESTRIL) 30 MG tablet   Oral   Take 30 mg by mouth every evening.          . Multiple Vitamin (MULTIVITAMIN WITH MINERALS) TABS tablet   Oral   Take 1 tablet by mouth daily.         . traZODone (DESYREL) 100 MG tablet   Oral   Take 100 mg by mouth at bedtime.           BP 144/68  Pulse 75  Temp(Src) 98 F (36.7 C) (Oral)  Resp 14  SpO2 100%  Filed Vitals:   08/04/13 1301 08/04/13 1516  BP: 144/68 143/61  Pulse: 75 73  Temp: 98 F (36.7 C)   TempSrc: Oral   Resp: 14 17  SpO2: 100% 100%    Physical Exam  Nursing note and vitals reviewed. Constitutional: She is oriented to person, place, and time. She appears well-developed and well-nourished. No distress.  HENT:  Head: Normocephalic and atraumatic.  Right Ear: External ear normal.   Left Ear: External ear normal.  Nose: Nose normal.  Mouth/Throat: Oropharynx is clear and moist. No oropharyngeal exudate.  Eyes: Conjunctivae are normal. Pupils are equal, round, and reactive to light. Right eye exhibits no discharge. Left eye exhibits no discharge.  Neck: Normal range of motion. Neck supple.  Cardiovascular: Normal rate, regular rhythm, normal heart sounds and intact distal pulses.  Exam reveals no gallop and no friction rub.   No murmur heard. Pulmonary/Chest: Effort normal and breath sounds normal. No respiratory distress. She has no wheezes. She has no rales. She exhibits no tenderness.  Abdominal: Soft. Bowel sounds are normal. She exhibits no distension and no mass. There is no tenderness. There is no rebound and no guarding.  Musculoskeletal: Normal range of motion. She exhibits edema. She exhibits no tenderness.  Trace pitting bilateral leg edema bilaterally  Neurological: She is alert and oriented to person, place, and time.  Skin: Skin is warm and dry. She is not diaphoretic.    ED Course   Procedures (including critical care time)  Labs Reviewed  D-DIMER, QUANTITATIVE - Abnormal; Notable for the following:    D-Dimer, Quant 1.86 (*)    All other components within normal limits  CBC  BASIC METABOLIC PANEL  PRO B NATRIURETIC PEPTIDE  TROPONIN I   No results found. No diagnosis found.  Results for orders placed during the hospital encounter of 08/04/13  CBC      Result Value Range   WBC 8.0  4.0 - 10.5 K/uL   RBC 4.33  3.87 - 5.11 MIL/uL   Hemoglobin 13.0  12.0 - 15.0 g/dL   HCT 16.1  09.6 - 04.5 %   MCV 90.5  78.0 - 100.0 fL   MCH 30.0  26.0 - 34.0 pg   MCHC 33.2  30.0 - 36.0 g/dL   RDW 40.9  81.1 - 91.4 %   Platelets 274  150 - 400 K/uL  BASIC METABOLIC PANEL      Result Value Range   Sodium 139  135 - 145 mEq/L   Potassium 4.0  3.5 - 5.1 mEq/L   Chloride 102  96 - 112 mEq/L   CO2 28  19 - 32 mEq/L   Glucose, Bld 89  70 - 99 mg/dL   BUN  15  6 - 23 mg/dL   Creatinine, Ser 7.82  0.50 - 1.10 mg/dL   Calcium 9.3  8.4 - 95.6 mg/dL   GFR calc non  Af Amer >90  >90 mL/min   GFR calc Af Amer >90  >90 mL/min  PRO B NATRIURETIC PEPTIDE      Result Value Range   Pro B Natriuretic peptide (BNP) 30.3  0 - 125 pg/mL  TROPONIN I      Result Value Range   Troponin I <0.30  <0.30 ng/mL  D-DIMER, QUANTITATIVE      Result Value Range   D-Dimer, Quant 1.86 (*) 0.00 - 0.48 ug/mL-FEU     Date: 08/04/2013  Rate: 71  Rhythm: normal sinus rhythm  QRS Axis: left  Intervals: PR prolonged 208 and QTc 456   ST/T Wave abnormalities: normal  Conduction Disutrbances:left anterior fascicular block  Narrative Interpretation:   Old EKG Reviewed: none available  DG Chest 2 View (Final result)  Result time: 08/04/13 13:40:58    Final result by Rad Results In Interface (08/04/13 13:40:58)    Narrative:   *RADIOLOGY REPORT*  Clinical Data: Left-sided chest pain, asthma, hypertension  CHEST - 2 VIEW  Comparison: None.  Findings: Cardiomediastinal silhouette is unremarkable. No acute infiltrate or pleural effusion. No pulmonary edema. Bony thorax is unremarkable.  IMPRESSION: No active disease.   Original Report Authenticated By: Natasha Mead, M.D.        CT Angio Chest PE W/Cm &/Or Wo Cm (Final result)  Result time: 08/04/13 15:44:39    Final result by Rad Results In Interface (08/04/13 15:44:39)    Narrative:   *RADIOLOGY REPORT*  Clinical Data: chest pain, shortness of breath  CT ANGIOGRAPHY CHEST  Technique: Multidetector CT imaging of the chest using the standard protocol during bolus administration of intravenous contrast. Multiplanar reconstructed images including MIPs were obtained and reviewed to evaluate the vascular anatomy.  Contrast: OMNIPAQUE IOHEXOL 350 MG/ML SOLN  Comparison: 08/04/13 chest radiograph  Findings: There are no filling defects in the pulmonary arterial system. The thoracic aorta shows no  dissection or dilatation. The lungs are clear. There is no significant adenopathy. There are no pleural effusions. The bony thorax is intact.  IMPRESSION: No acute abnormalities   Original Report Authenticated By: Esperanza Heir, M.D.    MDM  Novelle Addair is a 62 year old female with a PMH of HTN, HLD, asthma, and arthritis who presents to the ED for evaluation of SOB.  Aspirin chewable and nitro ordered.  Chest x-ray and EKG ordered.  Labs including a CBC, BMP, BNP, D-dimer, and troponin ordered to further evaluate.     Rechecks  2:35 PM = D-dimer elevated.  CT angio chest PE ordered.   2:50 PM = Patient had relief with her CP with nitro.  Now has headache.  Spoke with patient about admission and she is in agreement.   4:25 PM = Patient states she is feeling much better.  No questions or concerns.    Consults  3:51 PM = Spoke with Dr. Elvera Lennox who is in agreement with admission.  He would like me to call cardiology for them to order an inpatient stress test.   4:18 PM = Spoke with Dr. Mindi Curling who will consult on the patient and will order the inpatient stress test.      Cardiac causes of chest pressure cannot be excluded at this time.  Her risk factors for cardiac disease include age, HTN, HLD, and family history.  Patient will be admitted for chest pain rule out for observation.  Cardiology was consulted.  Troponin was negative and EKG had no ischemic changes.  Chest x-ray negative for  an acute cardiopulmonary process.  D-dimer positive however CT negative for pulmonary embolism.  Patient had relief of her chest pain with nitro.  Patient given ASA in the ED.  She remained in no acute distress throughout her ED visit.  She was in agreement with admission and plan.    Final impressions: 1. Chest pain    Greer Ee Tanica Gaige PA-C         Jillyn Ledger, [place] 08/04/13 [phone]

## 2013-08-04 NOTE — ED Notes (Signed)
Pt was at work this morning when she got up from bathroom to her desk and got "short winded" and the pressure in her chest got worse this morning.

## 2013-08-04 NOTE — H&P (Signed)
[facility] History and Physical  Zephyr Sausedo ZOX:[id number] DOB: 02-Jul-1951 DOA: 08/04/2013  Referring physician: Dr. Hyacinth Meeker PCP: Allean Found, MD  Specialists: Cardiology, Dr. Eldridge Dace   Chief Complaint: chest pain  HPI: [name] is a 62 y.o. female has a past medical history significant for obesity status post lap banding 2010, hypertension, hyperlipidemia presents with a chief complaint of chest pressure. She describes that over the past week, she does feel more fatigued than normal, and has had several episodes of lightheadedness and shortness or breath. She usually exercises by walking almost every day, and has noticed that yesterday when she tried to do that she felt extremely tired. Today, while sitting down, she experienced chest pressure "like someone was sitting on my chest" and left arm pain with numbness. She also experienced shortness of breath when this episode started. She denies any fever or chills, denies any nausea vomiting or diarrhea. She had a stress test about a year ago, at that time, she says her symptoms were very different. She had chest "pain" at that time, but this feels much more like a pressure/tightness in her chest and also goes on the left arm. In the ED, on receiving the nitroglycerin paste, patient had relief of her symptoms. Initial workup was pertinent for an elevated d-dimer, and patient subsequently underwent a CTPA which was negative for pulmonary embolus. She denies any weight gain in the last week or leg swelling. She has a family history of heart attacks, her mother had her first heart attack when she was in the mid 1s.    Review of Systems:  as per history of present illness, otherwise negative   Past Medical History  Diagnosis Date  . Arthritis   . Hypertension   . Asthma    Past Surgical History  Procedure Laterality Date  . Abdominal hysterectomy    . Joint replacement      both knees  . Laparoscopic gastric banding      Social History:  reports that she has never smoked. She has never used smokeless tobacco. She reports that she does not drink alcohol or use illicit drugs.  Allergies  Allergen Reactions  . Morphine And Related Hives and Rash  . Lipitor [Atorvastatin]     Joint pain    Family History  Problem Relation Age of Onset  . Stroke Mother   . Heart disease Mother   . Cancer Father     prostate  . Cancer Sister     colon   Prior to Admission medications   Medication Sig Start Date End Date Taking? Authorizing Provider  Calcium Carbonate-Vitamin D (CALCIUM 600 + D PO) Take 2 tablets by mouth at bedtime.   Yes Historical Provider, MD  hydrochlorothiazide (HYDRODIURIL) 25 MG tablet Take 25 mg by mouth every evening.    Yes Historical Provider, MD  lisinopril (PRINIVIL,ZESTRIL) 30 MG tablet Take 30 mg by mouth every evening.    Yes Historical Provider, MD  Multiple Vitamin (MULTIVITAMIN WITH MINERALS) TABS tablet Take 1 tablet by mouth daily.   Yes Historical Provider, MD  traZODone (DESYREL) 100 MG tablet Take 100 mg by mouth at bedtime.  05/09/13  Yes Historical Provider, MD   Physical Exam: Filed Vitals:   08/04/13 1301 08/04/13 1516  BP: 144/68 143/61  Pulse: 75 73  Temp: 98 F (36.7 C)   TempSrc: Oral   Resp: 14 17  SpO2: 100% 100%     General:  No apparent distress, pleasant African American female  Eyes: PERRL, EOMI, no scleral icterus  ENT: moist oropharynx  Neck: supple, no JVD  Cardiovascular: regular rate without MRG; 2+ peripheral pulses  Respiratory: CTA biL, good air movement without wheezing, rhonchi or crackled  Abdomen: soft, non tender to palpation, positive bowel sounds, no guarding, no rebound  Skin: no rashes  Musculoskeletal: no peripheral edema  Psychiatric: normal mood and affect  Neurologic: CN 2-12 grossly intact, MS 5/5 in all 4  Labs on Admission:  Basic Metabolic Panel:  Recent Labs Lab 08/04/13 1340  NA 139  K 4.0  CL 102  CO2  28  GLUCOSE 89  BUN 15  CREATININE 0.67  CALCIUM 9.3   CBC:  Recent Labs Lab 08/04/13 1340  WBC 8.0  HGB 13.0  HCT 39.2  MCV 90.5  PLT 274   Cardiac Enzymes:  Recent Labs Lab 08/04/13 1340  TROPONINI <0.30   BNP (last 3 results)  Recent Labs  08/04/13 1340  PROBNP 30.3   Radiological Exams on Admission: Dg Chest 2 View  08/04/2013   *RADIOLOGY REPORT*  Clinical Data: Left-sided chest pain, asthma, hypertension  CHEST - 2 VIEW  Comparison: None.  Findings: Cardiomediastinal silhouette is unremarkable.  No acute infiltrate or pleural effusion.  No pulmonary edema.  Bony thorax is unremarkable.  IMPRESSION: No active disease.   Original Report Authenticated By: Natasha Mead, M.D.   Ct Angio Chest Pe W/cm &/or Wo Cm  08/04/2013   *RADIOLOGY REPORT*  Clinical Data: chest pain, shortness of breath  CT ANGIOGRAPHY CHEST  Technique:  Multidetector CT imaging of the chest using the standard protocol during bolus administration of intravenous contrast. Multiplanar reconstructed images including MIPs were obtained and reviewed to evaluate the vascular anatomy.  Contrast: OMNIPAQUE IOHEXOL 350 MG/ML SOLN  Comparison: 08/04/13 chest radiograph  Findings: There are no filling defects in the pulmonary arterial system.  The thoracic aorta shows no dissection or dilatation.  The lungs are clear.  There is no significant adenopathy.  There are no pleural effusions.  The bony thorax is intact.  IMPRESSION: No acute abnormalities   Original Report Authenticated By: Esperanza Heir, M.D.   EKG: Independently reviewed. Sinus rhythm, relatively flat T waves in V4 - 6   Assessment/Plan Active Problems:   Hypertension   Obesity   Hyperlipidemia   Chest pain  Chest pain  - She has risk factors, hypertension, hyperlipidemia, obesity, family history of early MI. Cardiology consult appreciated for consideration for inpatient stress test versus other testing. - Admit to telemetry, cycle cardiac  enzymes, repeat EKG in the morning. Initial troponin negative. - 2D echo  Hypertension - Continue medications  Obesity - s/p Lapband 2010, continuing to work on weight loss as outpatient Hyperlipidemia - she will need to be started on statin upon discharge. DVT prophylaxis - Lovenox  Code Status:  full code   Family Communication:  none   Disposition Plan:  admit to observation   Time spent: 28  [name] M. Elvera Lennox, MD [facility] Pager [phone]  If 7PM-7AM, please contact night-coverage [email or web address] Password [email or web address] 08/04/2013, 4:23 PM

## 2013-08-04 NOTE — Consult Note (Addendum)
Admit date: 08/04/2013 Referring Physician  Dr. Lafe Garin Primary Cardiologist  Purnell Daigle-new Reason for Consultation  Chest pain  HPI: 62 y/o who has had HTN and obesity in the past.  She has been having some chest pressure over the past few weeks. She came to the hospital due to worsening sx.  She has been trying to walk more but has been feeling tired. SHe does not notice any specific CP with walking.    She had a stress test a few years go in [place], [place].  No stents.     PMH:   Past Medical History  Diagnosis Date  . Arthritis   . Hypertension   . Asthma   . PONV (postoperative nausea and vomiting)      PSH:   Past Surgical History  Procedure Laterality Date  . Abdominal hysterectomy    . Joint replacement      both knees  . Laparoscopic gastric banding      Allergies:  Morphine and related and Lipitor Prior to Admit Meds:   Prescriptions prior to admission  Medication Sig Dispense Refill  . Calcium Carbonate-Vitamin D (CALCIUM 600 + D PO) Take 2 tablets by mouth at bedtime.      . hydrochlorothiazide (HYDRODIURIL) 25 MG tablet Take 25 mg by mouth every evening.       Marland Kitchen lisinopril (PRINIVIL,ZESTRIL) 30 MG tablet Take 30 mg by mouth every evening.       . Multiple Vitamin (MULTIVITAMIN WITH MINERALS) TABS tablet Take 1 tablet by mouth daily.      . traZODone (DESYREL) 100 MG tablet Take 100 mg by mouth at bedtime.        Fam HX:    Family History  Problem Relation Age of Onset  . Stroke Mother   . Heart disease Mother   . Cancer Father     prostate  . Cancer Sister     colon   Social HX:    History   Social History  . Marital Status: Single    Spouse Name: N/A    Number of Children: N/A  . Years of Education: N/A   Occupational History  . Not on file.   Social History Main Topics  . Smoking status: Never Smoker   . Smokeless tobacco: Never Used  . Alcohol Use: No  . Drug Use: No  . Sexual Activity: No   Other Topics Concern  . Not on file   Social  History Narrative  . No narrative on file     ROS:  All 11 ROS were addressed and are negative except what is stated in the HPI  Physical Exam: Blood pressure 144/75, pulse 86, temperature 97.4 F (36.3 C), temperature source Oral, resp. rate 18, height 5\' 8"  (1.727 m), weight 119.477 kg (263 lb 6.4 oz), SpO2 100.00%.    General: Well developed, well nourished, in no acute distress Head:   Normal cephalic and atramatic  Lungs:   Clear bilaterally to auscultation and percussion. Heart:   HRRR S1 S2  Abdomen:  abdomen soft and non-tender without masses or                  Hernia's noted. Msk:   Normal strength and tone for age. Extremities:   No  edema.  DP +1 Neuro: Alert and oriented X 3. Psych:  Good affect, responds appropriately    Labs:   Lab Results  Component Value Date   WBC 8.0 08/04/2013   HGB 13.0  08/04/2013   HCT 39.2 08/04/2013   MCV 90.5 08/04/2013   PLT 274 08/04/2013    Recent Labs Lab 08/04/13 1340  NA 139  K 4.0  CL 102  CO2 28  BUN 15  CREATININE 0.67  CALCIUM 9.3  GLUCOSE 89   No results found for this basename: PTT   No results found for this basename: INR, PROTIME   Lab Results  Component Value Date   TROPONINI <0.30 08/04/2013     Lab Results  Component Value Date   CHOL 204* 01/09/2013   Lab Results  Component Value Date   HDL 44 01/09/2013   Lab Results  Component Value Date   LDLCALC 137* 01/09/2013   Lab Results  Component Value Date   TRIG 115 01/09/2013   Lab Results  Component Value Date   CHOLHDL 4.6 01/09/2013   No results found for this basename: LDLDIRECT      Radiology:  Dg Chest 2 View  08/04/2013   *RADIOLOGY REPORT*  Clinical Data: Left-sided chest pain, asthma, hypertension  CHEST - 2 VIEW  Comparison: None.  Findings: Cardiomediastinal silhouette is unremarkable.  No acute infiltrate or pleural effusion.  No pulmonary edema.  Bony thorax is unremarkable.  IMPRESSION: No active disease.   Original Report  Authenticated By: Natasha Mead, M.D.   Ct Angio Chest Pe W/cm &/or Wo Cm  08/04/2013   *RADIOLOGY REPORT*  Clinical Data: chest pain, shortness of breath  CT ANGIOGRAPHY CHEST  Technique:  Multidetector CT imaging of the chest using the standard protocol during bolus administration of intravenous contrast. Multiplanar reconstructed images including MIPs were obtained and reviewed to evaluate the vascular anatomy.  Contrast: OMNIPAQUE IOHEXOL 350 MG/ML SOLN  Comparison: 08/04/13 chest radiograph  Findings: There are no filling defects in the pulmonary arterial system.  The thoracic aorta shows no dissection or dilatation.  The lungs are clear.  There is no significant adenopathy.  There are no pleural effusions.  The bony thorax is intact.  IMPRESSION: No acute abnormalities   Original Report Authenticated By: Esperanza Heir, M.D.    EKG:  NSR, PRWP  ASSESSMENT: CP with some atypical features.    PLAN:  Plan for stress test.  BP control and weight loss along with regular exercise will be helpful.    HTN: continue lisinopril.  Corky Crafts., MD  08/04/2013  7:44 PM

## 2013-08-04 NOTE — ED Notes (Signed)
Per pt, SOB since this am-states increased weakness/dizziness on and off for awhile

## 2013-08-04 NOTE — ED Provider Notes (Signed)
62 year old female, history of hypertension, borderline hypercholesterolemia who does not smoke and does not have diabetes who presents with complaint of intermittent shortness of breath over the last week, mild orthopnea and a development of chest pressure with radiation to the left arm and numbness of the left arm this morning. She has an intermittent cough, no fever, no swelling, no other risk factors for pulmonary embolism. On exam she is clear heart and lung sounds, no tachycardia, no distress, no swelling or asymmetry of the lower extremities. EKG shows mild LVH with left axis deviation.  When the workup for both pulmonary embolus as well as coronary syndrome, there is no signs of ST elevation MI on the EKG. Anticipate admission to the hospital.  Medical screening examination/treatment/procedure(s) were conducted as a shared visit with non-physician practitioner(s) and myself.  I personally evaluated the patient during the encounter    Vida Roller, MD 08/04/13 1332

## 2013-08-05 ENCOUNTER — Other Ambulatory Visit (HOSPITAL_COMMUNITY): Payer: BC Managed Care – PPO

## 2013-08-05 ENCOUNTER — Other Ambulatory Visit: Payer: Self-pay

## 2013-08-05 ENCOUNTER — Observation Stay (HOSPITAL_COMMUNITY): Admit: 2013-08-05 | Payer: BC Managed Care – PPO

## 2013-08-05 ENCOUNTER — Observation Stay (HOSPITAL_COMMUNITY)
Admit: 2013-08-05 | Discharge: 2013-08-05 | Disposition: A | Discharge status: Home / Self Care | Payer: BC Managed Care – PPO | Attending: Interventional Cardiology | Admitting: Interventional Cardiology

## 2013-08-05 DIAGNOSIS — R079 Chest pain, unspecified: Secondary | ICD-10-CM

## 2013-08-05 LAB — CBC
HCT: 34 % — ABNORMAL LOW (ref 36.0–46.0)
Hemoglobin: 11.2 g/dL — ABNORMAL LOW (ref 12.0–15.0)
WBC: 7.5 10*3/uL (ref 4.0–10.5)

## 2013-08-05 LAB — TROPONIN I: Troponin I: <0.3 ng/mL (ref <0.30)

## 2013-08-05 LAB — BASIC METABOLIC PANEL
Calcium: 8.5 mg/dL (ref 8.4–10.5)
GFR calc Af Amer: >90 mL/min (ref >90)
GFR calc non Af Amer: >90 mL/min (ref >90)
Sodium: 140 mEq/L (ref 135–145)

## 2013-08-05 MED ORDER — REGADENOSON 0.4 MG/5ML IV SOLN
0.4000 mg | Freq: Once | INTRAVENOUS | Status: AC
  Administered 2013-08-05: 0.4 mg via INTRAVENOUS

## 2013-08-05 MED ORDER — TECHNETIUM TC 99M SESTAMIBI - CARDIOLITE
30.0000 | Freq: Once | INTRAVENOUS | Status: AC | PRN
  Administered 2013-08-05: 30 via INTRAVENOUS

## 2013-08-05 MED ORDER — SODIUM CHLORIDE 0.9 % IV SOLN
INTRAVENOUS | Status: DC
  Administered 2013-08-05: 02:00:00 via INTRAVENOUS

## 2013-08-05 MED ORDER — NITROGLYCERIN 2 % TD OINT
0.5000 [in_us] | TOPICAL_OINTMENT | Freq: Four times a day (QID) | TRANSDERMAL | Status: DC
  Filled 2013-08-05: qty 30

## 2013-08-05 MED ORDER — REGADENOSON 0.4 MG/5ML IV SOLN
INTRAVENOUS | Status: AC
  Filled 2013-08-05: qty 5

## 2013-08-05 MED ORDER — TECHNETIUM TC 99M SESTAMIBI - CARDIOLITE
10.0000 | Freq: Once | INTRAVENOUS | Status: AC | PRN
  Administered 2013-08-05: 10 via INTRAVENOUS

## 2013-08-05 MED ORDER — SODIUM CHLORIDE 0.9 % IV BOLUS (SEPSIS)
1000.0000 mL | Freq: Once | INTRAVENOUS | Status: AC
  Administered 2013-08-05: 1000 mL via INTRAVENOUS

## 2013-08-05 NOTE — Progress Notes (Signed)
BP 106/51

## 2013-08-05 NOTE — Progress Notes (Signed)
At this time, pt called writer to room and stated that she was "dizzy and can't see". Pt had gone to restroom. Writer got pt back to bed and took blood pressure. Right arm 67/31, left arm 55/39. Due to RR and NP on floor r/t to another pt, writer had both take a look at pt. NP ordered a one liter bolus of NS and RR took additional BP and assessed pt. Pt NPO for stress test in a.m. Troponin negative X 3.  Nitro. Patch removed. Holding midnight dose now. Monitoring pt closely.

## 2013-08-05 NOTE — Progress Notes (Signed)
One liter bolus given.  Starting NS at 75cc/hr.

## 2013-08-05 NOTE — Progress Notes (Signed)
BP 110/50, HR 68 Pt sleeping.

## 2013-08-05 NOTE — Progress Notes (Signed)
Alerted NP that BP is 95/52. Pt sleeping. HR 73. NS at 75 cc/hr. Order to hold 0600 Nitro. Paste.

## 2013-08-05 NOTE — Progress Notes (Signed)
Pt continues to lie in bed. Stating she can see better and dizziness is improving.  BP slowly rising. See documentation in VS.

## 2013-08-05 NOTE — Progress Notes (Signed)
Patient ID: [name], female   DOB: 1951/07/09, 62 y.o.   MRN: [id number]   SUBJECTIVE: Cardiology consult was done yesterday. The patient was scheduled for a nuclear stress test today. She was stable during the night. There was some decrease in blood pressure. The patient received some fluids.   Filed Vitals:   08/05/13 0332 08/05/13 0415 08/05/13 0450 08/05/13 0553  BP: 94/52 95/52 112/49 110/50  Pulse: 69 73 72 68  Temp:   98.5 F (36.9 C)   TempSrc:   Oral   Resp:      Height:      Weight:      SpO2:   99%     Intake/Output Summary (Last 24 hours) at 08/05/13 0706 Last data filed at 08/05/13 0600  Gross per 24 hour  Intake   1690 ml  Output      2 ml  Net   1688 ml    LABS: Basic Metabolic Panel:  Recent Labs  40/10/27 1340 08/05/13 0545  NA 139 140  K 4.0 3.6  CL 102 106  CO2 28 26  GLUCOSE 89 108*  BUN 15 14  CREATININE 0.67 0.71  CALCIUM 9.3 8.5   Liver Function Tests: No results found for this basename: AST, ALT, ALKPHOS, BILITOT, PROT, ALBUMIN,  in the last 72 hours No results found for this basename: LIPASE, AMYLASE,  in the last 72 hours CBC:  Recent Labs  08/04/13 1340 08/05/13 0545  WBC 8.0 7.5  HGB 13.0 11.2*  HCT 39.2 34.0*  MCV 90.5 90.2  PLT 274 219   Cardiac Enzymes:  Recent Labs  08/04/13 1809 08/04/13 2345 08/05/13 0545  TROPONINI <0.30 <0.30 <0.30   BNP: No components found with this basename: POCBNP,  D-Dimer:  Recent Labs  08/04/13 1340  DDIMER 1.86*   Hemoglobin A1C: No results found for this basename: HGBA1C,  in the last 72 hours Fasting Lipid Panel: No results found for this basename: CHOL, HDL, LDLCALC, TRIG, CHOLHDL, LDLDIRECT,  in the last 72 hours Thyroid Function Tests: No results found for this basename: TSH, T4TOTAL, FREET3, T3FREE, THYROIDAB,  in the last 72 hours  RADIOLOGY: Dg Chest 2 View  08/04/2013   *RADIOLOGY REPORT*  Clinical Data: Left-sided chest pain, asthma, hypertension  CHEST - 2  VIEW  Comparison: None.  Findings: Cardiomediastinal silhouette is unremarkable.  No acute infiltrate or pleural effusion.  No pulmonary edema.  Bony thorax is unremarkable.  IMPRESSION: No active disease.   Original Report Authenticated By: Natasha Mead, M.D.   Ct Angio Chest Pe W/cm &/or Wo Cm  08/04/2013   *RADIOLOGY REPORT*  Clinical Data: chest pain, shortness of breath  CT ANGIOGRAPHY CHEST  Technique:  Multidetector CT imaging of the chest using the standard protocol during bolus administration of intravenous contrast. Multiplanar reconstructed images including MIPs were obtained and reviewed to evaluate the vascular anatomy.  Contrast: OMNIPAQUE IOHEXOL 350 MG/ML SOLN  Comparison: 08/04/13 chest radiograph  Findings: There are no filling defects in the pulmonary arterial system.  The thoracic aorta shows no dissection or dilatation.  The lungs are clear.  There is no significant adenopathy.  There are no pleural effusions.  The bony thorax is intact.  IMPRESSION: No acute abnormalities   Original Report Authenticated By: Esperanza Heir, M.D.   Mm Digital Screening  07/10/2013   *RADIOLOGY REPORT*  Clinical Data: Screening.  DIGITAL SCREENING BILATERAL MAMMOGRAM WITH CAD DIGITAL BREAST TOMOSYNTHESIS  Digital breast tomosynthesis images are acquired in  two projections.  These images are reviewed in combination with the digital mammogram, confirming the findings below.  Comparison: Previous exam(s).  FINDINGS:  ACR Breast Density Category a:  The breast tissue is almost entirely fatty.  There are no findings suspicious for malignancy.  Images were processed with CAD.  IMPRESSION: No mammographic evidence of malignancy.  A result letter of this screening mammogram will be mailed directly to the patient.  RECOMMENDATION: Screening mammogram in one year. (Code:SM-B-01Y)  BI-RADS CATEGORY 1:  Negative.   Original Report Authenticated By: Baird Lyons, M.D.    PHYSICAL EXAM  patient is oriented to person  time and place. Affect is normal. Lungs are clear. Respiratory effort is nonlabored. Cardiac exam reveals S1 and S2.   TELEMETRY: I reviewed telemetry today August 05, 2013. There is sinus rhythm. There is some sinus bradycardia while she is sleeping.   ASSESSMENT AND PLAN:    Chest pain     Patient is scheduled for a stress test today.   [name] 08/05/2013 7:06 AM

## 2013-08-05 NOTE — Discharge Summary (Signed)
Physician Discharge Summary  [name]:[id number] DOB: 16-Jun-1951 DOA: 08/04/2013  PCP: Allean Found, MD  Admit date: 08/04/2013 Discharge date: 08/05/2013  Time spent: 45inutes  Recommendations for Outpatient Follow-up:  PCP in 1 week  Discharge Diagnoses:  Active Problems:   Hypertension   Obesity   Hyperlipidemia   Chest pain   Discharge Condition: stable Diet recommendation: heart healthy Filed Weights   08/04/13 1711  Weight: 119.477 kg (263 lb 6.4 oz)    History of present illness:  [name] is a 62 y.o. female has a past medical history significant for obesity status post lap banding 2010, hypertension, hyperlipidemia presents with a chief complaint of chest pressure. She describes that over the past week, she does feel more fatigued than normal, and has had several episodes of lightheadedness and shortness or breath. She usually exercises by walking almost every day, and has noticed that yesterday when she tried to do that she felt extremely tired. Today, while sitting down, she experienced chest pressure "like someone was sitting on my chest" and left arm pain with numbness.  In the ED, on receiving the nitroglycerin paste, patient had relief of her symptoms. Initial workup was pertinent for an elevated d-dimer, and patient subsequently underwent a CTPA which was negative for pulmonary embolus. She denies any weight gain in the last week or leg swelling. She has a family history of heart attacks, her mother had her first heart attack when she was in the mid 12s.    Hospital Course:  She was admitted to telemetry and ruled out for MI based on EKG and cardiac enzymes, had a CTA chest at admission which was negative for PE. Due to her history and cardiac risk factors she was seen by [facility] cardiology and underwent a Myoview which was normal, no evidence of inducible ischemia and EF of 74% She did not have any further episodes or chest pressure or tightness  since admission.  Procedures:  MYOVIEW IMPRESSION:  No evidence for pharmacologically induced ischemia.  Normal wall motion and calculated ejection fraction is 74%.    Consultations:  [facility]  Discharge Exam: Filed Vitals:   08/05/13 1355  BP: 116/54  Pulse: 66  Temp: 98.6 F (37 C)  Resp: 18    General: AAOx3 Cardiovascular: S1S2/RRR Respiratory: CTAB  Discharge Instructions  Discharge Orders   Future Appointments Provider Department Dept Phone   08/24/2013 9:15 AM [facility] [place] [phone]   Future Orders Complete By Expires   Diet - low sodium heart healthy  As directed    Increase activity slowly  As directed        Medication List         CALCIUM 600 + D PO  Take 2 tablets by mouth at bedtime.     hydrochlorothiazide 25 MG tablet  Commonly known as:  HYDRODIURIL  Take 25 mg by mouth every evening.     lisinopril 30 MG tablet  Commonly known as:  PRINIVIL,ZESTRIL  Take 30 mg by mouth every evening.     multivitamin with minerals Tabs tablet  Take 1 tablet by mouth daily.     traZODone 100 MG tablet  Commonly known as:  DESYREL  Take 100 mg by mouth at bedtime.       Allergies  Allergen Reactions  . Morphine And Related Hives and Rash  . Lipitor [Atorvastatin]     Joint pain       Follow-up Information  Follow up with Allean Found, MD In 1 week.   Specialty:  Family Medicine   Contact information:   [address] [place] [address] [phone]        The results of significant diagnostics from this hospitalization (including imaging, microbiology, ancillary and laboratory) are listed below for reference.    Significant Diagnostic Studies: Dg Chest 2 View  08/04/2013   *RADIOLOGY REPORT*  Clinical Data: Left-sided chest pain, asthma, hypertension  CHEST - 2 VIEW  Comparison: None.  Findings: Cardiomediastinal silhouette is unremarkable.  No acute infiltrate or  pleural effusion.  No pulmonary edema.  Bony thorax is unremarkable.  IMPRESSION: No active disease.   Original Report Authenticated By: Natasha Mead, M.D.   Ct Angio Chest Pe W/cm &/or Wo Cm  08/04/2013   *RADIOLOGY REPORT*  Clinical Data: chest pain, shortness of breath  CT ANGIOGRAPHY CHEST  Technique:  Multidetector CT imaging of the chest using the standard protocol during bolus administration of intravenous contrast. Multiplanar reconstructed images including MIPs were obtained and reviewed to evaluate the vascular anatomy.  Contrast: OMNIPAQUE IOHEXOL 350 MG/ML SOLN  Comparison: 08/04/13 chest radiograph  Findings: There are no filling defects in the pulmonary arterial system.  The thoracic aorta shows no dissection or dilatation.  The lungs are clear.  There is no significant adenopathy.  There are no pleural effusions.  The bony thorax is intact.  IMPRESSION: No acute abnormalities   Original Report Authenticated By: Esperanza Heir, M.D.   Nm Myocar Multi W/spect W/wall Motion / Ef  08/05/2013   *RADIOLOGY REPORT*  Clinical Data:  Chest pain.  Technique:  Standard myocardial SPECT imaging performed after resting intravenous injection of 10 mCi Tc-43m sestimibi. Subsequently, intravenous infusion of regadenoson performed under the supervision of the Cardiology staff.  At peak effect of the drug, 30 mCi Tc-71m sestimibi injected intravenously and standard myocardial SPECT imaging performed.  Quantitative gated imaging also performed to evaluate left ventricular wall motion and estimate left ventricular ejection fraction.  Comparison:  None  MYOCARDIAL IMAGING WITH SPECT (REST AND PHARMACOLOGIC-STRESS)  Findings:  There is no evidence for a reversible defect.  There may be apical thinning on both the rest and stress images.  Gut uptake on both the rest and stress images limits evaluation of the inferior wall.  GATED LEFT VENTRICULAR WALL MOTION STUDY  Findings:  Review of the gated images demonstrates  normal wall motion, including the apex.  LEFT VENTRICULAR EJECTION FRACTION  Findings:  QGS ejection fraction measures 74% , with an end- diastolic volume of 86 ml and an end-systolic volume of 22 ml.  IMPRESSION: No evidence for pharmacologically induced ischemia.  Normal wall motion and calculated ejection fraction is 74%.   Original Report Authenticated By: Richarda Overlie, M.D.   Mm Digital Screening  07/10/2013   *RADIOLOGY REPORT*  Clinical Data: Screening.  DIGITAL SCREENING BILATERAL MAMMOGRAM WITH CAD DIGITAL BREAST TOMOSYNTHESIS  Digital breast tomosynthesis images are acquired in two projections.  These images are reviewed in combination with the digital mammogram, confirming the findings below.  Comparison: Previous exam(s).  FINDINGS:  ACR Breast Density Category a:  The breast tissue is almost entirely fatty.  There are no findings suspicious for malignancy.  Images were processed with CAD.  IMPRESSION: No mammographic evidence of malignancy.  A result letter of this screening mammogram will be mailed directly to the patient.  RECOMMENDATION: Screening mammogram in one year. (Code:SM-B-01Y)  BI-RADS CATEGORY 1:  Negative.   Original Report  Authenticated By: Baird Lyons, M.D.    Microbiology: No results found for this or any previous visit (from the past 240 hour(s)).   Labs: Basic Metabolic Panel:  Recent Labs Lab 08/04/13 1340 08/05/13 0545  NA 139 140  K 4.0 3.6  CL 102 106  CO2 28 26  GLUCOSE 89 108*  BUN 15 14  CREATININE 0.67 0.71  CALCIUM 9.3 8.5   Liver Function Tests: No results found for this basename: AST, ALT, ALKPHOS, BILITOT, PROT, ALBUMIN,  in the last 168 hours No results found for this basename: LIPASE, AMYLASE,  in the last 168 hours No results found for this basename: AMMONIA,  in the last 168 hours CBC:  Recent Labs Lab 08/04/13 1340 08/05/13 0545  WBC 8.0 7.5  HGB 13.0 11.2*  HCT 39.2 34.0*  MCV 90.5 90.2  PLT 274 219   Cardiac Enzymes:  Recent  Labs Lab 08/04/13 1340 08/04/13 1809 08/04/13 2345 08/05/13 0545  TROPONINI <0.30 <0.30 <0.30 <0.30   BNP: BNP (last 3 results)  Recent Labs  08/04/13 1340  PROBNP 30.3   CBG: No results found for this basename: GLUCAP,  in the last 168 hours     Signed:  Mc Hollen  [facility] 08/05/2013, 5:07 PM

## 2013-08-05 NOTE — Progress Notes (Signed)
BP slowly rising. NP back to floor to check on pt.

## 2013-08-06 NOTE — ED Provider Notes (Signed)
Medical screening examination/treatment/procedure(s) were conducted as a shared visit with non-physician practitioner(s) and myself.  I personally evaluated the patient during the encounter  Please see my separate respective documentation pertaining to this patient encounter   Vida Roller, MD 08/06/13 [phone]

## 2013-08-17 ENCOUNTER — Encounter (INDEPENDENT_AMBULATORY_CARE_PROVIDER_SITE_OTHER): Payer: BC Managed Care – PPO

## 2013-08-24 ENCOUNTER — Encounter (INDEPENDENT_AMBULATORY_CARE_PROVIDER_SITE_OTHER): Payer: BC Managed Care – PPO

## 2013-09-27 IMAGING — CR DG CHEST 2V
2 series · 2 of 2 positions shown · non-contrast
Comparison: None.

CLINICAL DATA: Left-sided chest pain, asthma, hypertension

CHEST - 2 VIEW

[w chest pa]
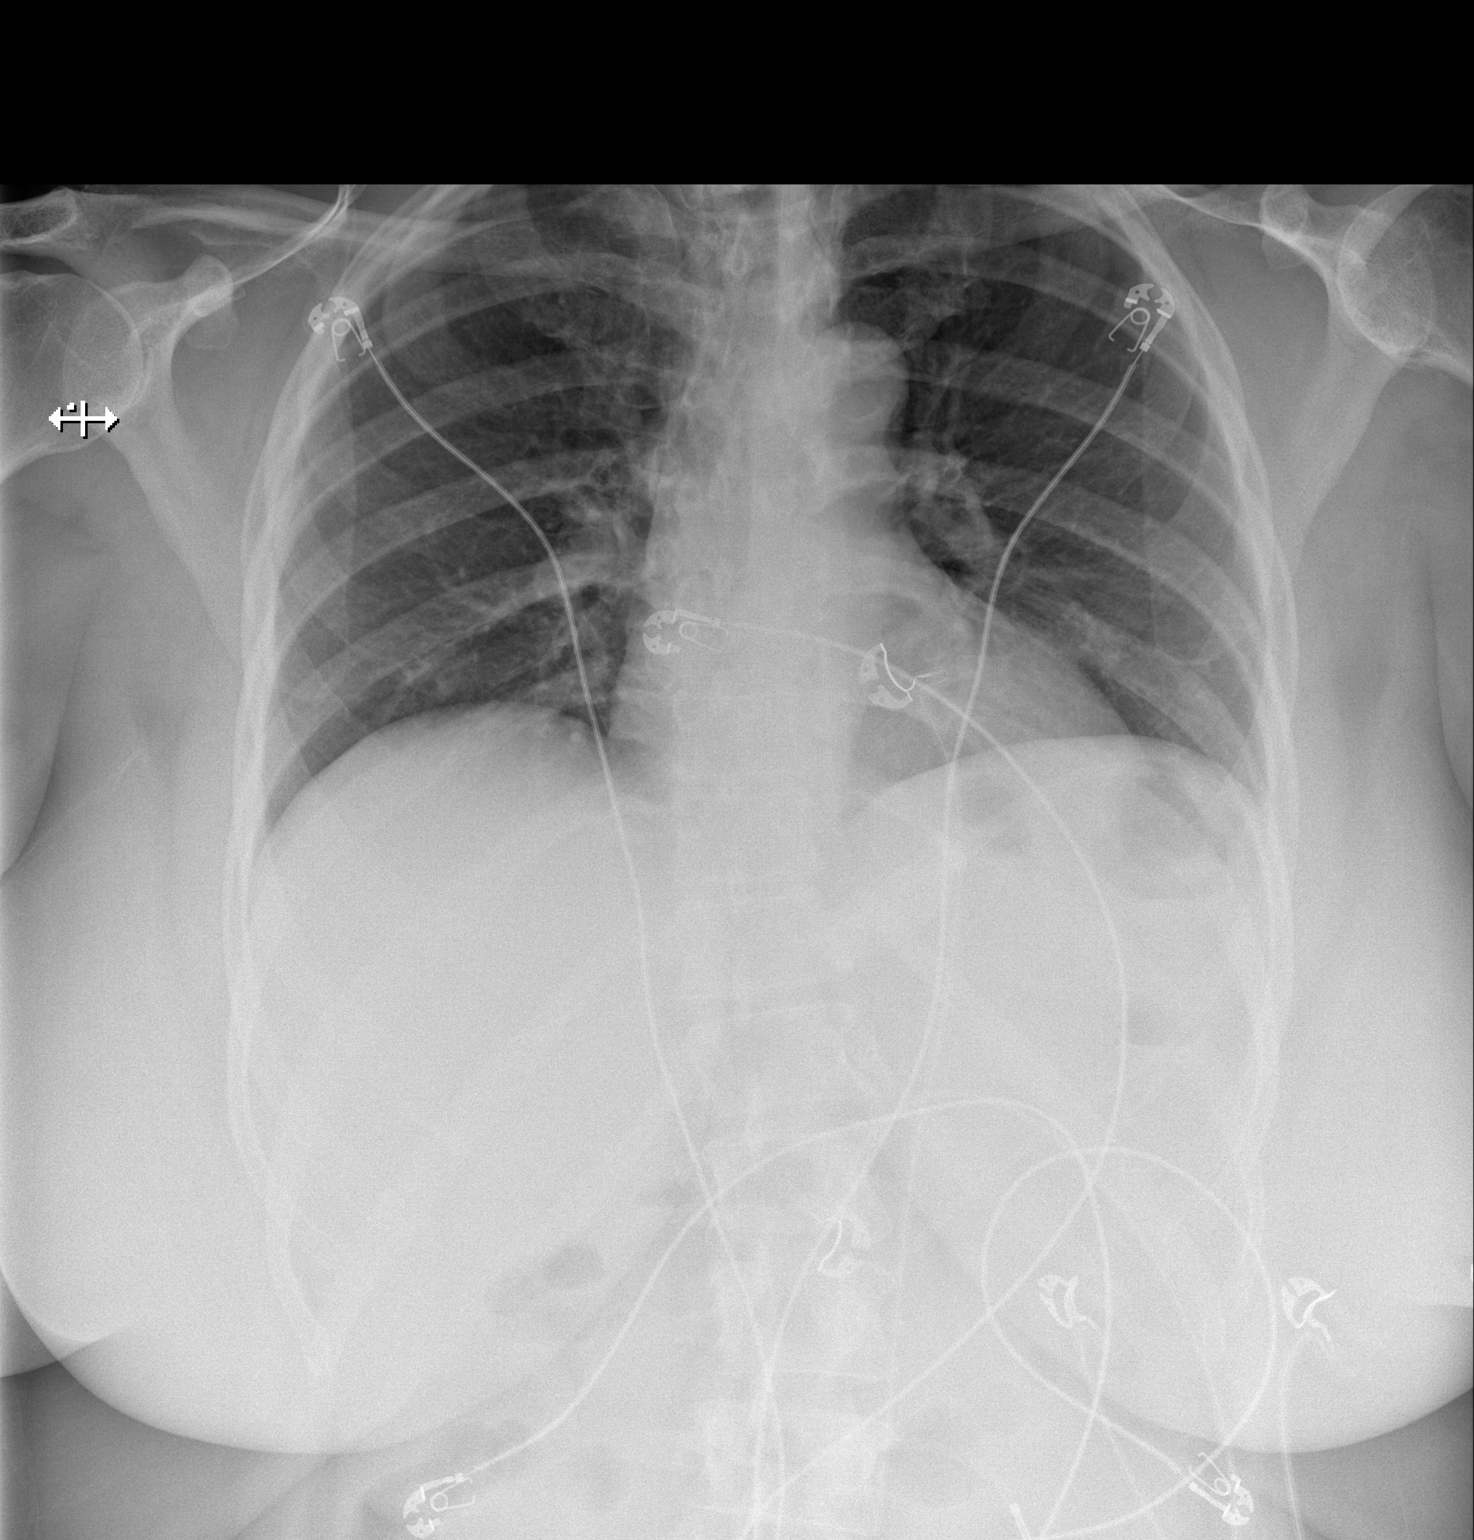

[w chest lat]
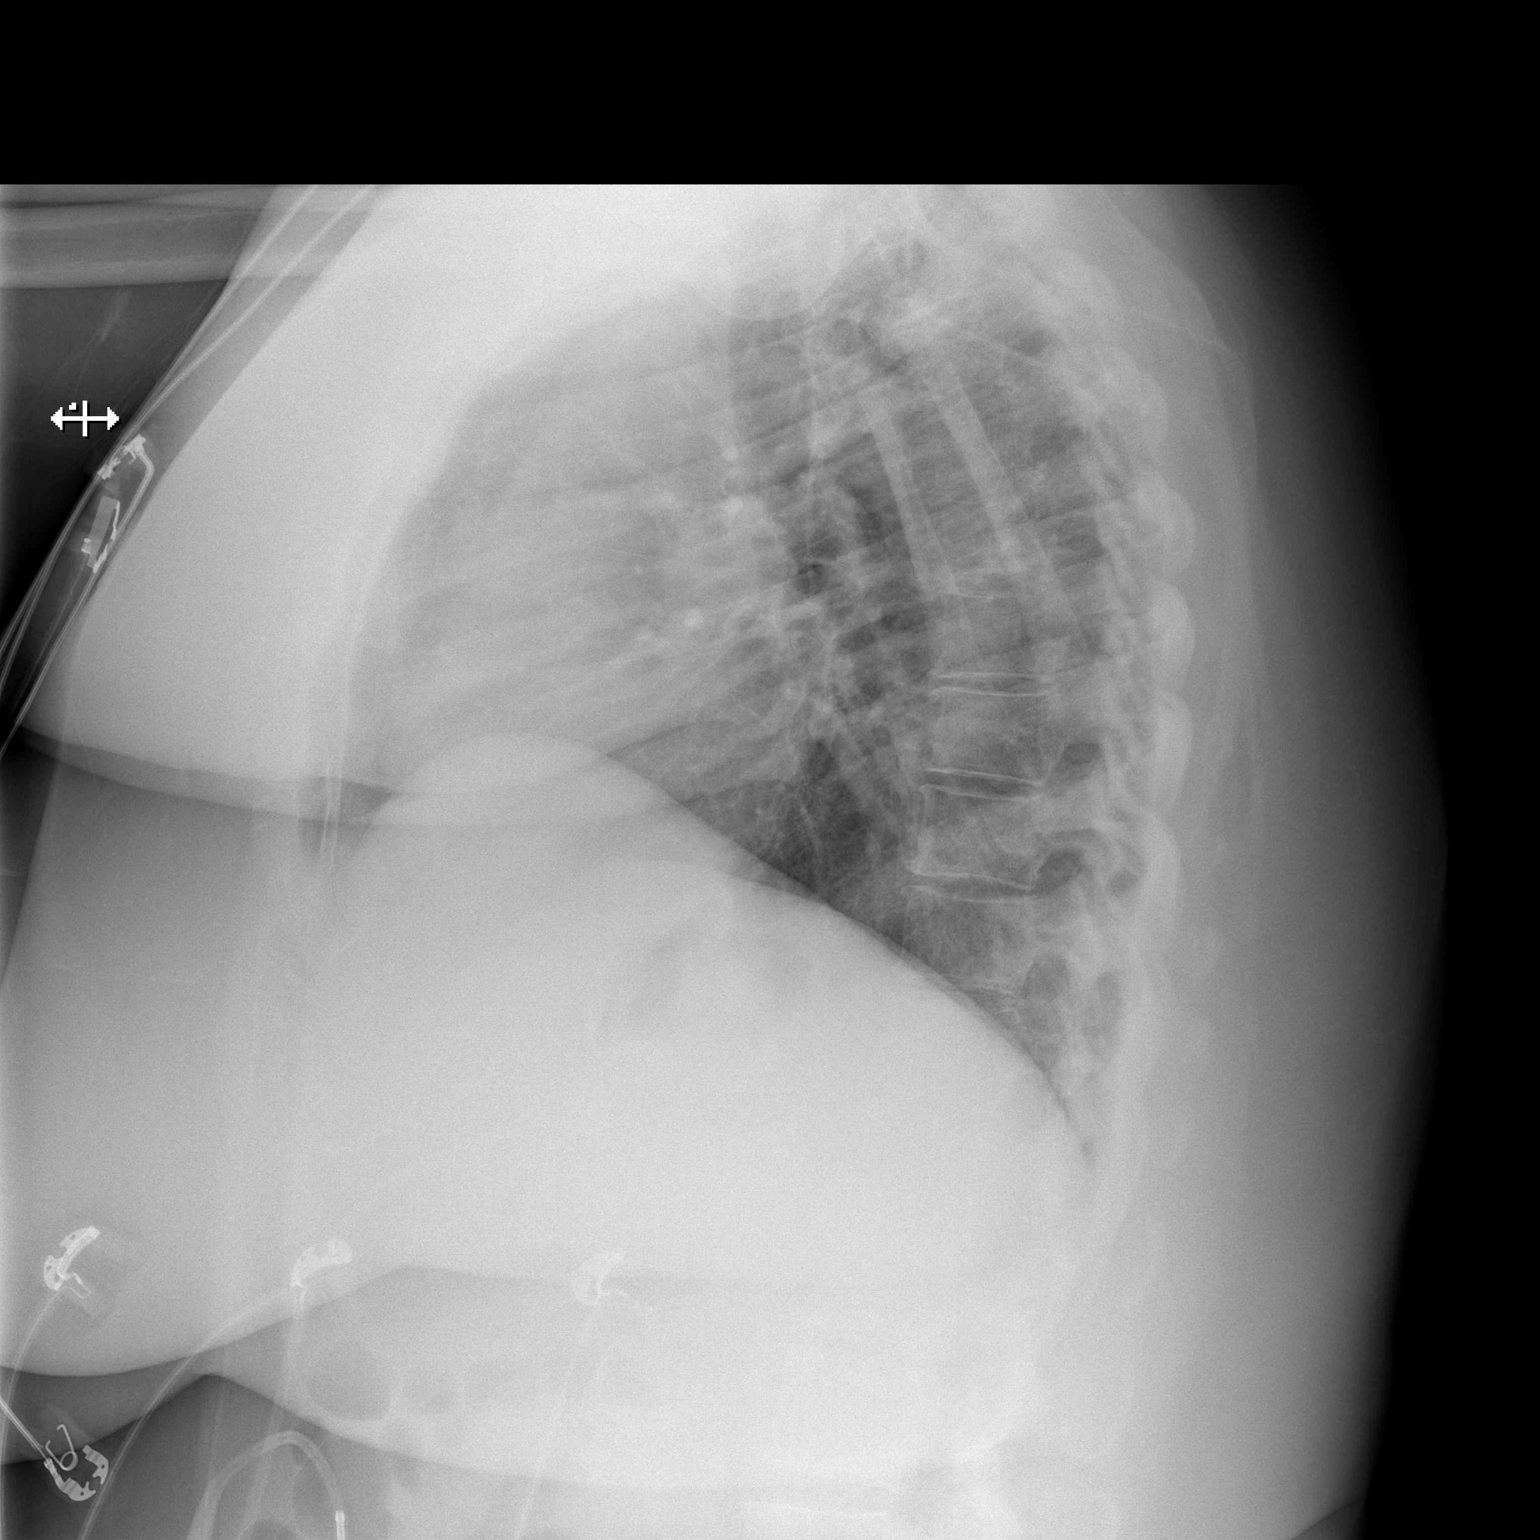

[2 of 2 positions shown; findings below may reference images not displayed]

FINDINGS: Cardiomediastinal silhouette is unremarkable.  No acute
infiltrate or pleural effusion.  No pulmonary edema.  Bony thorax
is unremarkable.
IMPRESSION: No active disease.

## 2013-10-19 ENCOUNTER — Ambulatory Visit (INDEPENDENT_AMBULATORY_CARE_PROVIDER_SITE_OTHER): Payer: BC Managed Care – PPO | Admitting: Physician Assistant

## 2013-10-19 ENCOUNTER — Encounter (INDEPENDENT_AMBULATORY_CARE_PROVIDER_SITE_OTHER): Payer: Self-pay

## 2013-10-19 VITALS — BP 142/80 | HR 70 | Temp 98.2°F | Resp 16 | Ht 68.0 in | Wt 267.6 lb

## 2013-10-19 DIAGNOSIS — Z9884 Bariatric surgery status: Secondary | ICD-10-CM

## 2013-10-19 NOTE — Progress Notes (Signed)
  HISTORY: [name] is a 62 y.o.female who received an AP-Standard lap-band in August 2010 in [place]. She comes in with 2.6 lbs weight gain since her last visit. She had persistent reflux for which she is being successfully treated with omeprazole 40 mg daily, prescribed by her PCP. She complains of persistent hunger but in delving deeper into her eating habits, it appears her diet is dominated by carbohydrates and foods that pass the band easily. She describes difficulty in chewing food adequately because of her dentition. She is disappointed with her weight gain.  VITAL SIGNS: Filed Vitals:   10/19/13 1558  BP: 142/80  Pulse: 70  Temp: 98.2 F (36.8 C)  Resp: 16    PHYSICAL EXAM: Physical exam reveals a very well-appearing 62 y.o.female in no apparent distress Neurologic: Awake, alert, oriented Psych: Bright affect, conversant Respiratory: Breathing even and unlabored. No stridor or wheezing Extremities: Atraumatic, good range of motion. Skin: Warm, Dry, no rashes Musculoskeletal: Normal gait, Joints normal  ASSESMENT: 62 y.o.  female  s/p AP-Standard lap-band.   PLAN: We talked extensively about how to get proteins in that will hold her longer which will in turn keep her from reaching for high carb foods repeatedly. I recommended high protein foods that are easy to chew and asked her to avoid carbs. She is going to try several small meals a day rather than attempting larger ones less frequently. We'll have her back after Thanksgiving for re-evaluation.

## 2013-10-19 NOTE — Patient Instructions (Signed)
Return in one month. Focus on good food choices as well as physical activity. Return sooner if you have an increase in hunger, portion sizes or weight. Return also for difficulty swallowing, night cough, reflux.   

## 2013-11-23 ENCOUNTER — Encounter (INDEPENDENT_AMBULATORY_CARE_PROVIDER_SITE_OTHER): Payer: BC Managed Care – PPO

## 2013-11-30 ENCOUNTER — Encounter (INDEPENDENT_AMBULATORY_CARE_PROVIDER_SITE_OTHER): Payer: BC Managed Care – PPO

## 2013-12-28 ENCOUNTER — Encounter (INDEPENDENT_AMBULATORY_CARE_PROVIDER_SITE_OTHER): Payer: Self-pay

## 2013-12-28 ENCOUNTER — Ambulatory Visit
Admission: RE | Admit: 2013-12-28 | Discharge: 2013-12-28 | Disposition: A | Discharge status: Home / Self Care | Payer: BC Managed Care – PPO | Source: Ambulatory Visit | Attending: Physician Assistant | Admitting: Physician Assistant

## 2013-12-28 ENCOUNTER — Ambulatory Visit (INDEPENDENT_AMBULATORY_CARE_PROVIDER_SITE_OTHER): Payer: BC Managed Care – PPO | Admitting: Physician Assistant

## 2013-12-28 VITALS — BP 128/82 | HR 82 | Temp 97.8°F | Resp 14 | Ht 68.0 in | Wt 268.8 lb

## 2013-12-28 DIAGNOSIS — R1310 Dysphagia, unspecified: Secondary | ICD-10-CM

## 2013-12-28 DIAGNOSIS — Z4651 Encounter for fitting and adjustment of gastric lap band: Secondary | ICD-10-CM

## 2013-12-28 NOTE — Progress Notes (Addendum)
  HISTORY: Rose BihariMarilyn Jackson is a 63 y.o.female who received an AP-Standard lap-band in August 2010. She comes in with complaints of persistent solid food dysphagia and belching. She had these same symptoms earlier last year. She also complains of abdominal wall cramping that changes location from time to time. It is not associated with eating or movement.  VITAL SIGNS: Filed Vitals:   12/28/13 1626  BP: 128/82  Pulse: 82  Temp: 97.8 F (36.6 C)  Resp: 14    PHYSICAL EXAM: Physical exam reveals a very well-appearing 63 y.o.female in no apparent distress. She is belching excessively during our visit. Neurologic: Awake, alert, oriented Psych: Bright affect, conversant Respiratory: Breathing even and unlabored. No stridor or wheezing Abdomen: Soft, nontender, nondistended to palpation. Incisions well-healed. No incisional hernias. Port easily palpated. Extremities: Atraumatic, good range of motion.  ASSESMENT: 63 y.o.  female  s/p AP-Standard lap-band.   PLAN: The patient's port was accessed with a 20G Huber needle without difficulty. Clear fluid was aspirated and 4 mL saline was removed from the port to give a total predicted volume of 0 mL. The patient was advised to concentrate on healthy food choices and to avoid slider foods high in fats and carbohydrates. I'm not sure of the source of the belching other than aerophagia, but it is socially undesirable and embarrassing for her. I've ordered a KUB to re-assess band position and have asked her to see Dr. Daphine DeutscherMartin for his advice.  [KUB - 12/28/2013 - lap band looks in okay position.  DN 01/01/2014]

## 2013-12-28 NOTE — Patient Instructions (Signed)
Obtain your x-ray. Follow-up with Dr. Daphine DeutscherMartin.

## 2014-01-01 ENCOUNTER — Telehealth (INDEPENDENT_AMBULATORY_CARE_PROVIDER_SITE_OTHER): Payer: Self-pay | Admitting: *Deleted

## 2014-01-03 ENCOUNTER — Encounter (INDEPENDENT_AMBULATORY_CARE_PROVIDER_SITE_OTHER): Payer: BC Managed Care – PPO | Admitting: Surgery

## 2014-01-11 ENCOUNTER — Ambulatory Visit (INDEPENDENT_AMBULATORY_CARE_PROVIDER_SITE_OTHER): Payer: BC Managed Care – PPO | Admitting: Physician Assistant

## 2014-01-11 ENCOUNTER — Encounter (INDEPENDENT_AMBULATORY_CARE_PROVIDER_SITE_OTHER): Payer: Self-pay | Admitting: Surgery

## 2014-01-11 ENCOUNTER — Encounter (INDEPENDENT_AMBULATORY_CARE_PROVIDER_SITE_OTHER): Payer: BC Managed Care – PPO | Admitting: Surgery

## 2014-01-11 VITALS — BP 118/78 | HR 78 | Resp 12 | Ht 68.0 in | Wt 271.0 lb

## 2014-01-11 DIAGNOSIS — Z4651 Encounter for fitting and adjustment of gastric lap band: Secondary | ICD-10-CM

## 2014-01-11 NOTE — Patient Instructions (Signed)

## 2014-01-11 NOTE — Progress Notes (Signed)
  HISTORY: [name] is a 63 y.o.female who received an AP-Standard lap-band in 2010 in [place]. She is being seen today by Dr. Daphine DeutscherMartin who evaluated her recent history. I saw her in clinic earlier this month for persistent burping and symptoms of over-restriction. I removed all her fluid. Since then she has no further untoward symptoms but now has hunger and larger portions, as expected. She has gained 3 lbs. She has joined the gym and is scheduled to work out with a Psychologist, educationaltrainer today.  VITAL SIGNS: Filed Vitals:   01/11/14 0953  BP: 118/78  Pulse: 78  Resp: 12    PHYSICAL EXAM: Physical exam reveals a very well-appearing 63 y.o.female in no apparent distress Neurologic: Awake, alert, oriented Psych: Bright affect, conversant Respiratory: Breathing even and unlabored. No stridor or wheezing Abdomen: Soft, nontender, nondistended to palpation. Incisions well-healed. No incisional hernias. Port easily palpated. Extremities: Atraumatic, good range of motion.  ASSESMENT: 63 y.o.  female  s/p AP-Standard lap-band.   PLAN: The patient's port was accessed with a 20G Huber needle without difficulty. Clear fluid was aspirated and 3 mL saline was added to the port to give a total predicted volume of 3 mL. The patient was able to swallow water without difficulty following the procedure and was instructed to take clear liquids for the next 24-48 hours and advance slowly as tolerated. I'll see her again in three weeks.

## 2014-02-22 ENCOUNTER — Encounter (INDEPENDENT_AMBULATORY_CARE_PROVIDER_SITE_OTHER): Payer: BC Managed Care – PPO

## 2014-03-01 ENCOUNTER — Ambulatory Visit (INDEPENDENT_AMBULATORY_CARE_PROVIDER_SITE_OTHER): Payer: BC Managed Care – PPO | Admitting: Physician Assistant

## 2014-03-01 ENCOUNTER — Encounter (INDEPENDENT_AMBULATORY_CARE_PROVIDER_SITE_OTHER): Payer: Self-pay | Admitting: Physician Assistant

## 2014-03-01 DIAGNOSIS — Z4651 Encounter for fitting and adjustment of gastric lap band: Secondary | ICD-10-CM

## 2014-03-01 NOTE — Patient Instructions (Signed)

## 2014-03-01 NOTE — Progress Notes (Signed)
  HISTORY: Rose BihariMarilyn Jackson is a 63 y.o.female who received an AP-Standard lap-band in August 2010 in [place]. She comes in with three pounds of weight gain since her last visit in late January. She was seen for a 3 mL fill after a band vacation due to obstructive symptoms (solid food dysphagia with excessive belching). She has no further obstructive symptoms but she has significant hunger and portion sizes. She is exercising 30-45 minutes 3-4 days a week.  VITAL SIGNS: There were no vitals filed for this visit.  PHYSICAL EXAM: Physical exam reveals a very well-appearing 63 y.o.female in no apparent distress Neurologic: Awake, alert, oriented Psych: Bright affect, conversant Respiratory: Breathing even and unlabored. No stridor or wheezing Abdomen: Soft, nontender, nondistended to palpation. Incisions well-healed. No incisional hernias. Port easily palpated. Extremities: Atraumatic, good range of motion.  ASSESMENT: 63 y.o.  female  s/p AP-Standard lap-band.   PLAN: The patient's port was accessed with a 20G Huber needle without difficulty. Clear fluid was aspirated and 0.5 mL saline was added to the port to give a total predicted volume of 3.5 mL. The patient was able to swallow water without difficulty following the procedure and was instructed to take clear liquids for the next 24-48 hours and advance slowly as tolerated.

## 2014-03-29 ENCOUNTER — Encounter (INDEPENDENT_AMBULATORY_CARE_PROVIDER_SITE_OTHER): Payer: BC Managed Care – PPO

## 2014-04-12 ENCOUNTER — Encounter (INDEPENDENT_AMBULATORY_CARE_PROVIDER_SITE_OTHER): Payer: BC Managed Care – PPO

## 2014-04-26 ENCOUNTER — Encounter (INDEPENDENT_AMBULATORY_CARE_PROVIDER_SITE_OTHER): Payer: BC Managed Care – PPO

## 2014-05-03 ENCOUNTER — Encounter (INDEPENDENT_AMBULATORY_CARE_PROVIDER_SITE_OTHER): Payer: Self-pay | Admitting: Physician Assistant

## 2014-05-03 ENCOUNTER — Ambulatory Visit (INDEPENDENT_AMBULATORY_CARE_PROVIDER_SITE_OTHER): Payer: BC Managed Care – PPO | Admitting: Physician Assistant

## 2014-05-03 DIAGNOSIS — R1012 Left upper quadrant pain: Secondary | ICD-10-CM

## 2014-05-03 DIAGNOSIS — Z9884 Bariatric surgery status: Secondary | ICD-10-CM

## 2014-05-03 NOTE — Patient Instructions (Signed)
Obtain your CT once scheduled and follow-up with Dr. Daphine DeutscherMartin afterward. Please call the office should you have worsening pain, fever, vomiting or inability to pass gas or stool. If in an emergency, go to the closest emergency department.

## 2014-05-03 NOTE — Progress Notes (Addendum)
  HISTORY: [name] is a 63 y.o.female who received an AP-Standard lap-band in August 2010 in [place]. She comes in with complaints of increasing LUQ pain, especially when laying on her side or moving. She has some symptoms of solid food dysphagia on occasion but she does not want fluid removed. No fever, No persistent nausea, no constipation. No recent trauma. She is very frustrated with her lack of success with the band thus far, and the recurrent issues with over-restriction. She has lost down to 230 lbs but has been unable to return to that point.  VITAL SIGNS: Filed Vitals:   05/03/14 1614  BP: 134/80  Pulse: 81  Temp: 97.8 F (36.6 C)    PHYSICAL EXAM: Physical exam reveals a very well-appearing 63 y.o.female in no apparent distress Neurologic: Awake, alert, oriented Psych: Bright affect, conversant Respiratory: Breathing even and unlabored. No stridor or wheezing Extremities: Atraumatic, good range of motion. Skin: Warm, Dry, no rashes Musculoskeletal: Normal gait, Joints normal Abdomen: Soft, nontender, nondistended. Obese. Port deep but palpable. No obvious hernia. No rebound. Port nontender. Incisions well healed.  ASSESMENT: 63 y.o.  female  s/p AP-Standard lap-band, now with LUQ abdominal pain.   PLAN: I will order a CT A/P to evaluate her pain as I can find no clear physical findings for this. I've scheduled her to see Dr. Daphine DeutscherMartin following this for discussion of results and to discuss her weight loss issues at present an any possible future interventions. I've asked her to return if worse, especially with fever, vomiting or worsening pain.  KUB - 12/28/2013 - lap band position looked okay.   CT pending.  Ovidio Kinavid Newman, MD, [facility] Pager: [phone] Office phone:  [phone]

## 2014-05-04 ENCOUNTER — Other Ambulatory Visit (INDEPENDENT_AMBULATORY_CARE_PROVIDER_SITE_OTHER): Payer: Self-pay | Admitting: *Deleted

## 2014-05-04 DIAGNOSIS — R1012 Left upper quadrant pain: Secondary | ICD-10-CM

## 2014-05-08 ENCOUNTER — Ambulatory Visit
Admission: RE | Admit: 2014-05-08 | Discharge: 2014-05-08 | Disposition: A | Discharge status: Home / Self Care | Payer: BC Managed Care – PPO | Source: Ambulatory Visit | Attending: Physician Assistant | Admitting: Physician Assistant

## 2014-05-08 ENCOUNTER — Other Ambulatory Visit: Payer: BC Managed Care – PPO

## 2014-05-08 DIAGNOSIS — R1012 Left upper quadrant pain: Secondary | ICD-10-CM

## 2014-05-08 MED ORDER — IOHEXOL 300 MG/ML  SOLN
125.0000 mL | Freq: Once | INTRAMUSCULAR | Status: AC | PRN
  Administered 2014-05-08: 125 mL via INTRAVENOUS

## 2014-05-16 ENCOUNTER — Telehealth (INDEPENDENT_AMBULATORY_CARE_PROVIDER_SITE_OTHER): Payer: Self-pay | Admitting: *Deleted

## 2014-05-16 ENCOUNTER — Ambulatory Visit (INDEPENDENT_AMBULATORY_CARE_PROVIDER_SITE_OTHER): Payer: BC Managed Care – PPO | Admitting: Surgery

## 2014-05-16 ENCOUNTER — Encounter (INDEPENDENT_AMBULATORY_CARE_PROVIDER_SITE_OTHER): Payer: Self-pay | Admitting: Surgery

## 2014-05-16 VITALS — BP 130/70 | HR 76 | Temp 97.8°F | Resp 16 | Ht 68.0 in | Wt 274.0 lb

## 2014-05-16 DIAGNOSIS — R1012 Left upper quadrant pain: Secondary | ICD-10-CM

## 2014-05-16 NOTE — Progress Notes (Signed)
Lapband Fill Encounter Problem List:   Patient Active Problem List   Diagnosis Date Noted  . Obesity 08/04/2013  . Hyperlipidemia 08/04/2013  . Chest pain 08/04/2013  . Lapband APS Aug 2010 in [place] 10/17/2012  . Hypertension 10/17/2012    Tonette Bihari Body mass index is 41.67 kg/(m^2). Weight loss since surgery  27  Having regurgitation?:  no  Feel that they need a fill?  maybe  Nocturnal reflux?  no  Amount of fill  0     Instructions given and weight loss goals discussed.    CT scan reviewed.  Small umbilical hernia noted may contain fat-?cause of her pain.  No other acute findings.  Reviewed.   Will ask her to be seen by Maralyn Sago Himmelrich to get back on proper foods.  She has not seen dietician since [place].  Will see back in 4 weeks.   Matt B. Daphine Deutscher, MD, FACS

## 2014-05-16 NOTE — Telephone Encounter (Signed)
LM for pt to return my call regarding a appt with Dr. Daphine Deutscher.  Pt returned my call while I was typing.   Victorino Dike

## 2014-05-25 ENCOUNTER — Other Ambulatory Visit (INDEPENDENT_AMBULATORY_CARE_PROVIDER_SITE_OTHER): Payer: Self-pay | Admitting: *Deleted

## 2014-06-15 ENCOUNTER — Encounter (INDEPENDENT_AMBULATORY_CARE_PROVIDER_SITE_OTHER): Payer: BC Managed Care – PPO | Admitting: Surgery

## 2014-06-20 ENCOUNTER — Telehealth (INDEPENDENT_AMBULATORY_CARE_PROVIDER_SITE_OTHER): Payer: Self-pay | Admitting: *Deleted

## 2014-06-20 NOTE — Telephone Encounter (Signed)
LMOM regarding appt for lap band 06-21-14.  Was going to ask pt if she could come in earlier.  We have several cancellations.  If pt calls, please find me.  Thanks!  Victorino DikeJennifer

## 2014-06-21 ENCOUNTER — Ambulatory Visit (INDEPENDENT_AMBULATORY_CARE_PROVIDER_SITE_OTHER): Payer: BC Managed Care – PPO | Admitting: Physician Assistant

## 2014-06-21 ENCOUNTER — Encounter (INDEPENDENT_AMBULATORY_CARE_PROVIDER_SITE_OTHER): Payer: Self-pay

## 2014-06-21 ENCOUNTER — Encounter (INDEPENDENT_AMBULATORY_CARE_PROVIDER_SITE_OTHER): Payer: BC Managed Care – PPO

## 2014-06-21 VITALS — BP 130/72 | Ht 68.0 in | Wt 267.6 lb

## 2014-06-21 DIAGNOSIS — Z4651 Encounter for fitting and adjustment of gastric lap band: Secondary | ICD-10-CM

## 2014-06-21 NOTE — Progress Notes (Signed)
  HISTORY: Rose BihariMarilyn Jackson is a 63 y.o.female who received an AP-Standard lap-band in August 2010 by Dr. Daphine DeutscherMartin. She comes in with 6 lbs weight loss since her last visit. She has connected with [name] and has gotten better with her diet. She is increasing her protein intake. Despite this she still feels that she's eating more than desired at one sitting. She has no regurgitation, reflux or burping (which is her primary sign of obstruction).  VITAL SIGNS: Filed Vitals:   06/21/14 1100  BP: 130/72    PHYSICAL EXAM: Physical exam reveals a very well-appearing 63 y.o.female in no apparent distress Neurologic: Awake, alert, oriented Psych: Bright affect, conversant Respiratory: Breathing even and unlabored. No stridor or wheezing Abdomen: Soft, nontender, nondistended to palpation. Incisions well-healed. No incisional hernias. Port easily palpated. Extremities: Atraumatic, good range of motion.  ASSESMENT: 63 y.o.  female  s/p AP-Standard lap-band.   PLAN: The patient's port was accessed with a 20G Huber needle without difficulty. Clear fluid was aspirated and 0.25 mL saline was added to the port to give a total predicted volume of 3.75 mL. She begins to have issues at 4 mL. The patient was able to swallow water without difficulty following the procedure and was instructed to take clear liquids for the next 24-48 hours and advance slowly as tolerated.

## 2014-06-21 NOTE — Patient Instructions (Signed)

## 2014-07-16 ENCOUNTER — Other Ambulatory Visit: Payer: Self-pay

## 2014-07-16 DIAGNOSIS — Z1231 Encounter for screening mammogram for malignant neoplasm of breast: Secondary | ICD-10-CM

## 2014-07-20 ENCOUNTER — Ambulatory Visit
Admission: RE | Admit: 2014-07-20 | Discharge: 2014-07-20 | Disposition: A | Discharge status: Home / Self Care | Payer: BC Managed Care – PPO | Source: Ambulatory Visit

## 2014-07-20 DIAGNOSIS — Z1231 Encounter for screening mammogram for malignant neoplasm of breast: Secondary | ICD-10-CM

## 2014-09-20 ENCOUNTER — Encounter (INDEPENDENT_AMBULATORY_CARE_PROVIDER_SITE_OTHER): Payer: BC Managed Care – PPO

## 2015-02-14 ENCOUNTER — Other Ambulatory Visit (INDEPENDENT_AMBULATORY_CARE_PROVIDER_SITE_OTHER): Payer: Self-pay | Admitting: Physician Assistant

## 2015-02-14 ENCOUNTER — Other Ambulatory Visit (INDEPENDENT_AMBULATORY_CARE_PROVIDER_SITE_OTHER): Payer: Self-pay | Admitting: *Deleted

## 2015-02-14 ENCOUNTER — Other Ambulatory Visit (HOSPITAL_COMMUNITY): Payer: Self-pay | Admitting: Physician Assistant

## 2015-02-14 DIAGNOSIS — Z9884 Bariatric surgery status: Secondary | ICD-10-CM

## 2015-02-14 DIAGNOSIS — R1314 Dysphagia, pharyngoesophageal phase: Secondary | ICD-10-CM

## 2015-02-21 ENCOUNTER — Ambulatory Visit (HOSPITAL_COMMUNITY)
Admission: RE | Admit: 2015-02-21 | Discharge: 2015-02-21 | Disposition: A | Discharge status: Home / Self Care | Payer: BC Managed Care – PPO | Source: Ambulatory Visit | Attending: Family Medicine | Admitting: Family Medicine

## 2015-02-21 ENCOUNTER — Ambulatory Visit (HOSPITAL_COMMUNITY)
Admission: RE | Admit: 2015-02-21 | Discharge: 2015-02-21 | Disposition: A | Discharge status: Home / Self Care | Payer: BC Managed Care – PPO | Source: Ambulatory Visit | Attending: Physician Assistant | Admitting: Physician Assistant

## 2015-02-21 DIAGNOSIS — Z9884 Bariatric surgery status: Secondary | ICD-10-CM

## 2015-02-21 DIAGNOSIS — J45909 Unspecified asthma, uncomplicated: Secondary | ICD-10-CM | POA: Insufficient documentation

## 2015-02-21 DIAGNOSIS — I10 Essential (primary) hypertension: Secondary | ICD-10-CM | POA: Insufficient documentation

## 2015-02-21 DIAGNOSIS — R1310 Dysphagia, unspecified: Secondary | ICD-10-CM | POA: Insufficient documentation

## 2015-02-21 DIAGNOSIS — R1314 Dysphagia, pharyngoesophageal phase: Secondary | ICD-10-CM

## 2015-02-21 DIAGNOSIS — M1990 Unspecified osteoarthritis, unspecified site: Secondary | ICD-10-CM | POA: Diagnosis not present

## 2015-02-21 NOTE — Progress Notes (Signed)
Speech Language Pathology  Name: Rose BihariMarilyn Schoof MRN: [id number] DOB: January 26, 1951 Today's Date: 02/21/2015 Time: 1100-1130 SLP Time Calculation (min) (ACUTE ONLY): 30 min       MBS COMPLETED. GO TO CHART REVIEW, THEN IMAGING TO FIND REPORT.      Rose CoonsLisa Willis Jackson M.Ed [employer] Pager [phone]

## 2015-07-15 ENCOUNTER — Other Ambulatory Visit: Payer: Self-pay

## 2015-07-15 DIAGNOSIS — Z1231 Encounter for screening mammogram for malignant neoplasm of breast: Secondary | ICD-10-CM

## 2015-07-24 ENCOUNTER — Other Ambulatory Visit: Payer: Self-pay

## 2015-07-24 DIAGNOSIS — R1012 Left upper quadrant pain: Secondary | ICD-10-CM

## 2015-07-24 DIAGNOSIS — R1900 Intra-abdominal and pelvic swelling, mass and lump, unspecified site: Secondary | ICD-10-CM

## 2015-07-30 ENCOUNTER — Other Ambulatory Visit: Payer: BC Managed Care – PPO

## 2015-08-01 ENCOUNTER — Ambulatory Visit
Admission: RE | Admit: 2015-08-01 | Discharge: 2015-08-01 | Disposition: A | Discharge status: Home / Self Care | Payer: BLUE CROSS/BLUE SHIELD | Source: Ambulatory Visit

## 2015-08-01 DIAGNOSIS — Z1231 Encounter for screening mammogram for malignant neoplasm of breast: Secondary | ICD-10-CM

## 2015-08-02 ENCOUNTER — Ambulatory Visit
Admission: RE | Admit: 2015-08-02 | Discharge: 2015-08-02 | Disposition: A | Discharge status: Home / Self Care | Payer: BLUE CROSS/BLUE SHIELD | Source: Ambulatory Visit | Attending: Surgery | Admitting: Surgery

## 2015-08-02 MED ORDER — IOPAMIDOL (ISOVUE-300) INJECTION 61%
125.0000 mL | Freq: Once | INTRAVENOUS | Status: AC | PRN
  Administered 2015-08-02: 125 mL via INTRAVENOUS

## 2016-01-24 ENCOUNTER — Ambulatory Visit: Payer: Self-pay | Admitting: Surgery

## 2016-01-24 NOTE — H&P (Signed)
[name] 01/24/2016 10:50 AM Location: [facility] Patient #: [id number] DOB: December 29, 1950 Single / Language: [place] / Race: Black or African American Female  History of Present Illness Molli Hazard B. Daphine Deutscher MD; 01/24/2016 11:25 AM) The patient is a 65 year old female who presents for a bariatric surgery evaluation. Ms Droz got her approval via appeal to her insurance company. We discussed lap band to bypass and lap band to sleeve. I think we will proceed with a lap band removal and conversion to sleeve gastrectomy. She is aware of the nature of the procedure.  She is concerned that her sleep apnea has returned. I would like for her to have fitting for a mask. She had one before her previous surgery and it went away when she had her weight loss with bands when it was still working.  She has a surgery date on the sixth. I put in orders for sleeve gastrectomy.   Allergies (Sonya Bynum, CMA; 01/24/2016 10:50 AM) Morphine Sulfate (Concentrate) *ANALGESICS - OPIOID* Lipitor *ANTIHYPERLIPIDEMICS*  Medication History (Sonya Bynum, CMA; 01/24/2016 10:50 AM) AmLODIPine Besylate (2.5MG  Tablet, Oral) Active. BuPROPion HCl ER (XL) (  Tablet ER 24HR, Oral) Active. PredniSONE (  Tablet, Oral) Active. Methocarbamol (  Tablet, Oral) Active. Cyclobenzaprine HCl (  Tablet, Oral) Active. Calcium-Vitamin D (  Tablet Chewable, Oral daily) Active. HydroDiuril (  Tablet, Oral daily) Active. Lisinopril (  Tablet, Oral daily) Active. Multi Vitamin Daily (Oral) Active. PriLOSEC (  Capsule DR, Oral as needed) Active. Ambien CR (12.5MG  Tablet ER, Oral daily) Active. Medications Reconciled    Vitals (Sonya Bynum CMA; 01/24/2016 10:50 AM) 01/24/2016 10:50 AM Weight: 289 lb Height: 68in Body Surface Area: 2.39 m Body Mass Index: 43.94 kg/m  Pulse: 76 (Regular)  BP: 132/78 (Sitting, Left Arm, Standard)      Physical Exam (Hamdan Toscano B. Daphine Deutscher MD;  01/24/2016 11:32 AM)  The physical exam findings are as follows: Note:HEENT glasses Neck without masses; supple Chest clear Heart SR without murmurs Abd lapband port on the left (placed in CN) Ext FROM    Assessment & Plan Molli Hazard B. 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 Daphine Deutscher, MD, FACS

## 2016-01-29 ENCOUNTER — Encounter: Payer: Self-pay | Admitting: Dietician

## 2016-01-29 ENCOUNTER — Encounter: Payer: BLUE CROSS/BLUE SHIELD | Attending: Surgery | Admitting: Dietician

## 2016-01-29 DIAGNOSIS — Z6841 Body Mass Index (BMI) 40.0 and over, adult: Secondary | ICD-10-CM | POA: Diagnosis not present

## 2016-01-29 DIAGNOSIS — Z9884 Bariatric surgery status: Secondary | ICD-10-CM | POA: Insufficient documentation

## 2016-01-29 NOTE — Progress Notes (Signed)
  Pre-Op Assessment Visit:  Pre-Operative Sleeve Gastrectomy Surgery  Medical Nutrition Therapy:  Appt start time: 0735   End time:  0820.  Patient was seen on 01/29/2016 for Pre-Operative Nutrition Assessment. Assessment and letter of approval faxed to [facility] Bariatric Surgery Program coordinator on 01/29/2016.   Preferred Learning Style:   No preference indicated   Learning Readiness:   Ready  Handouts given during visit include:  Pre-Op Goals Bariatric Surgery Protein Shakes   During the appointment today the following Pre-Op Goals were reviewed with the patient: Maintain or lose weight as instructed by your surgeon Make healthy food choices Begin to limit portion sizes Limited concentrated sugars and fried foods Keep fat/sugar in the single digits per serving on   food labels Practice CHEWING your food  (aim for 30 chews per bite or until applesauce consistency) Practice not drinking 15 minutes before, during, and 30 minutes after each meal/snack Avoid all carbonated beverages  Avoid/limit caffeinated beverages  Avoid all sugar-sweetened beverages Consume 3 meals per day; eat every 3-5 hours Make a list of non-food related activities Aim for 64-100 ounces of FLUID daily  Aim for at least 60-80 grams of PROTEIN daily Look for a liquid protein source that contain ?15 g protein and ?5 g carbohydrate  (ex: shakes, drinks, shots)  Demonstrated degree of understanding via:  Teach Back  Teaching Method Utilized:  Visual Auditory Hands on  Barriers to learning/adherence to lifestyle change: none  Patient to call the [facility] to enroll in Pre-Op and Post-Op Nutrition Education when surgery date is scheduled.

## 2016-02-04 NOTE — Progress Notes (Signed)
  Pre-Operative Nutrition Class:  Appt start time: 5379   End time:  1830.  Patient was seen on 02/03/2016 for Pre-Operative Bariatric Surgery Education at the [facility].   Surgery date: 02/24/2016 Surgery type: sleeve gastrectomy Start weight at [facility]: 287 lbs on 01/29/16 Weight today: 290 lbs  TANITA  BODY COMP RESULTS  02/03/16   BMI (kg/m^2) 44.7   Fat Mass (lbs) 162   Fat Free Mass (lbs) 128   Total Body Water (lbs) 93.5   Samples given per MNT protocol. Patient educated on appropriate usage: Celebrate Multivitamin (black cherry - qty 1) Lot #: [id number] Exp: 07/2017  Celebrate Calcium Citrate chew (chocolate - qty 1) Lot #: [id number] Exp: 02/2017  Renee Pain (vanilla - qty 1) Lot #: [id number] Exp: 08/2016  PB2 (qty 1) Lot #: [id number] Exp: 09/2017  Premier Protein shake (chocolate - qty 1) Lot #: [id number] Exp: 09/2016  The following the learning objectives were met by the patient during this course:  Identify Pre-Op Dietary Goals and will begin 2 weeks pre-operatively  Identify appropriate sources of fluids and proteins   State protein recommendations and appropriate sources pre and post-operatively  Identify Post-Operative Dietary Goals and will follow for 2 weeks post-operatively  Identify appropriate multivitamin and calcium sources  Describe the need for physical activity post-operatively and will follow MD recommendations  State when to call healthcare provider regarding medication questions or post-operative complications  Handouts given during class include:  Pre-Op Bariatric Surgery Diet Handout  Protein Shake Handout  Post-Op Bariatric Surgery Nutrition Handout  BELT Program Information Flyer  Support Group Information Flyer  [facility] Bariatric Supplements Price List  Follow-Up Plan: Patient will follow-up at [facility] 2 weeks post operatively for diet advancement per MD.

## 2016-02-12 ENCOUNTER — Ambulatory Visit
Admission: RE | Admit: 2016-02-12 | Discharge: 2016-02-12 | Disposition: A | Discharge status: Home / Self Care | Payer: BLUE CROSS/BLUE SHIELD | Source: Ambulatory Visit | Attending: Family Medicine | Admitting: Family Medicine

## 2016-02-12 ENCOUNTER — Other Ambulatory Visit: Payer: Self-pay | Admitting: Family Medicine

## 2016-02-12 DIAGNOSIS — R519 Headache, unspecified: Secondary | ICD-10-CM

## 2016-02-12 DIAGNOSIS — R51 Headache: Principal | ICD-10-CM

## 2016-02-12 MED ORDER — IOPAMIDOL (ISOVUE-300) INJECTION 61%
75.0000 mL | Freq: Once | INTRAVENOUS | Status: AC | PRN
  Administered 2016-02-12: 75 mL via INTRAVENOUS

## 2016-02-13 ENCOUNTER — Encounter (HOSPITAL_COMMUNITY): Payer: Self-pay

## 2016-02-13 ENCOUNTER — Encounter (HOSPITAL_COMMUNITY)
Admission: RE | Admit: 2016-02-13 | Discharge: 2016-02-13 | Disposition: A | Discharge status: Home / Self Care | Payer: BLUE CROSS/BLUE SHIELD | Source: Ambulatory Visit | Attending: Surgery | Admitting: Surgery

## 2016-02-13 DIAGNOSIS — Z01812 Encounter for preprocedural laboratory examination: Secondary | ICD-10-CM | POA: Insufficient documentation

## 2016-02-13 DIAGNOSIS — Y848 Other medical procedures as the cause of abnormal reaction of the patient, or of later complication, without mention of misadventure at the time of the procedure: Secondary | ICD-10-CM | POA: Insufficient documentation

## 2016-02-13 DIAGNOSIS — K9509 Other complications of gastric band procedure: Secondary | ICD-10-CM | POA: Diagnosis not present

## 2016-02-13 HISTORY — DX: Claustrophobia: F40.240

## 2016-02-13 HISTORY — DX: Sleep apnea, unspecified: G47.30

## 2016-02-13 LAB — CBC WITH DIFFERENTIAL/PLATELET
BASOS ABS: 0 10*3/uL (ref 0.0–0.1)
BASOS PCT: 0 %
EOS PCT: 1 %
Eosinophils Absolute: 0.1 10*3/uL (ref 0.0–0.7)
HCT: 41.3 % (ref 36.0–46.0)
Hemoglobin: 13.2 g/dL (ref 12.0–15.0)
Lymphocytes Relative: 21 %
Lymphs Abs: 1.9 10*3/uL (ref 0.7–4.0)
MCH: 29.5 pg (ref 26.0–34.0)
MCHC: 32 g/dL (ref 30.0–36.0)
MCV: 92.2 fL (ref 78.0–100.0)
MONO ABS: 0.6 10*3/uL (ref 0.1–1.0)
Monocytes Relative: 7 %
Neutro Abs: 6.4 10*3/uL (ref 1.7–7.7)
Neutrophils Relative %: 71 %
PLATELETS: 289 10*3/uL (ref 150–400)
RBC: 4.48 MIL/uL (ref 3.87–5.11)
RDW: 14.1 % (ref 11.5–15.5)
WBC: 9 10*3/uL (ref 4.0–10.5)

## 2016-02-13 LAB — COMPREHENSIVE METABOLIC PANEL
ALBUMIN: 4.3 g/dL (ref 3.5–5.0)
ALT: 26 U/L (ref 14–54)
AST: 34 U/L (ref 15–41)
Alkaline Phosphatase: 80 U/L (ref 38–126)
Anion gap: 11 (ref 5–15)
BUN: 23 mg/dL — AB (ref 6–20)
CHLORIDE: 105 mmol/L (ref 101–111)
CO2: 28 mmol/L (ref 22–32)
Calcium: 10.1 mg/dL (ref 8.9–10.3)
Creatinine, Ser: 0.74 mg/dL (ref 0.44–1.00)
GFR calc Af Amer: >60 mL/min (ref >60)
GFR calc non Af Amer: >60 mL/min (ref >60)
GLUCOSE: 126 mg/dL — AB (ref 65–99)
POTASSIUM: 4.4 mmol/L (ref 3.5–5.1)
Sodium: 144 mmol/L (ref 135–145)
Total Bilirubin: 0.6 mg/dL (ref 0.3–1.2)
Total Protein: 7.4 g/dL (ref 6.5–8.1)

## 2016-02-13 NOTE — Patient Instructions (Addendum)
YOUR PROCEDURE IS SCHEDULED ON :  02/24/16  REPORT TO [facility] MAIN ENTRANCE FOLLOW SIGNS TO EAST ELEVATOR - GO TO 3rd FLOOR CHECK IN AT 3 EAST NURSES STATION (SHORT STAY) AT:  5:15 AM  CALL THIS NUMBER IF YOU HAVE PROBLEMS THE MORNING OF SURGERY [phone]  REMEMBER:ONLY 1 PER PERSON MAY GO TO SHORT STAY WITH YOU TO GET READY THE MORNING OF YOUR SURGERY  DO NOT EAT FOOD OR DRINK LIQUIDS AFTER MIDNIGHT  TAKE THESE MEDICINES THE MORNING OF SURGERY: MAY USE ALBUTEROL IF NEEDED  YOU MAY NOT HAVE ANY METAL ON YOUR BODY INCLUDING HAIR PINS AND PIERCING'S. DO NOT WEAR JEWELRY, MAKEUP, LOTIONS, POWDERS OR PERFUMES. DO NOT WEAR NAIL POLISH. DO NOT SHAVE 48 HRS PRIOR TO SURGERY. MEN MAY SHAVE FACE AND NECK.  DO NOT BRING VALUABLES TO HOSPITAL. [facility] IS NOT RESPONSIBLE FOR VALUABLES.  CONTACTS, DENTURES OR PARTIALS MAY NOT BE WORN TO SURGERY. LEAVE SUITCASE IN CAR. CAN BE BROUGHT TO ROOM AFTER SURGERY.  PATIENTS DISCHARGED THE DAY OF SURGERY WILL NOT BE ALLOWED TO DRIVE HOME.  PLEASE READ OVER THE FOLLOWING INSTRUCTION SHEETS _________________________________________________________________________________                                          [facility] - PREPARING FOR SURGERY  Before surgery, you can play an important role.  Because skin is not sterile, your skin needs to be as free of germs as possible.  You can reduce the number of germs on your skin by washing with CHG (chlorahexidine gluconate) soap before surgery.  CHG is an antiseptic cleaner which kills germs and bonds with the skin to continue killing germs even after washing. Please DO NOT use if you have an allergy to CHG or antibacterial soaps.  If your skin becomes reddened/irritated stop using the CHG and inform your nurse when you arrive at Short Stay. Do not shave (including legs and underarms) for at least 48 hours prior to the first CHG shower.  You may shave your face. Please follow these  instructions carefully:   1.  Shower with CHG Soap the night before surgery and the  morning of Surgery.   2.  If you choose to wash your hair, wash your hair first as usual with your  normal  Shampoo.   3.  After you shampoo, rinse your hair and body thoroughly to remove the  shampoo.                                         4.  Use CHG as you would any other liquid soap.  You can apply chg directly  to the skin and wash . Gently wash with scrungie or clean wascloth    5.  Apply the CHG Soap to your body ONLY FROM THE NECK DOWN.   Do not use on open                           Wound or open sores. Avoid contact with eyes, ears mouth and genitals (private parts).                        Genitals (private parts) with your  normal soap.              6.  Wash thoroughly, paying special attention to the area where your surgery  will be performed.   7.  Thoroughly rinse your body with warm water from the neck down.   8.  DO NOT shower/wash with your normal soap after using and rinsing off  the CHG Soap .                9.  Pat yourself dry with a clean towel.             10.  Wear clean night clothes to bed after shower             11.  Place clean sheets on your bed the night of your first shower and do not  sleep with pets.  Day of Surgery : Do not apply any lotions/deodorants the morning of surgery.  Please wear clean clothes to the hospital/surgery center.  FAILURE TO FOLLOW THESE INSTRUCTIONS MAY RESULT IN THE CANCELLATION OF YOUR SURGERY    PATIENT SIGNATURE_________________________________  ______________________________________________________________________

## 2016-02-14 ENCOUNTER — Ambulatory Visit: Payer: Self-pay | Admitting: Surgery

## 2016-02-14 ENCOUNTER — Other Ambulatory Visit: Payer: Self-pay | Admitting: Family Medicine

## 2016-02-14 DIAGNOSIS — G4489 Other headache syndrome: Secondary | ICD-10-CM

## 2016-02-14 NOTE — H&P (Signed)
Chief Complaint:  lapband complications for removal and possible sleeve gastrectomy  History of Present Illness:  Rose Descoteaux is an 65 y.o. female who had a lapband in [place] and despite being very compliant in CN and while followed here she has not had optimal weight loss and is frustrated by GER and port pain.  She is here for removal and sleeve gastrectomy.  Past Medical History  Diagnosis Date  . Arthritis   . Hypertension   . Asthma   . PONV (postoperative nausea and vomiting)   . Sleep apnea     HX of sleep apnea not currently using c pap  . Claustrophobia     Past Surgical History  Procedure Laterality Date  . Abdominal hysterectomy    . Joint replacement      both knees  . Laparoscopic gastric banding      Current Outpatient Prescriptions  Medication Sig Dispense Refill  . albuterol (PROVENTIL HFA;VENTOLIN HFA) 108 (90 Base) MCG/ACT inhaler Inhale 1 puff into the lungs every 6 (six) hours as needed for wheezing or shortness of breath.    . amLODipine (NORVASC) 2.5 MG tablet Take 2.5 mg by mouth at bedtime.     . hydrochlorothiazide (HYDRODIURIL) 25 MG tablet Take 25 mg by mouth every evening.     . lisinopril (PRINIVIL,ZESTRIL) 30 MG tablet Take 30 mg by mouth at bedtime.     . Multiple Vitamin (MULTIVITAMIN WITH MINERALS) TABS tablet Take 1 tablet by mouth daily.    . zolpidem (AMBIEN CR) 12.5 MG CR tablet Take 12.5 mg by mouth at bedtime.      No current facility-administered medications for this visit.   Morphine and related and Lipitor Family History  Problem Relation Age of Onset  . Stroke Mother   . Heart disease Mother   . Cancer Father     prostate  . Cancer Sister     colon   Social History:   reports that she has never smoked. She has never used smokeless tobacco. She reports that she does not drink alcohol or use illicit drugs.   REVIEW OF SYSTEMS : Negative except for see problem list  Physical Exam:   There were no vitals taken for this  visit. There is no weight on file to calculate BMI.  Gen:  WDWN AAF NAD  Neurological: Alert and oriented to person, place, and time. Motor and sensory function is grossly intact  Head: Normocephalic and atraumatic.  Eyes: Conjunctivae are normal. Pupils are equal, round, and reactive to light. No scleral icterus.  Neck: Normal range of motion. Neck supple. No tracheal deviation or thyromegaly present.  Cardiovascular:  SR without murmurs or gallops.  No carotid bruits Breast:  Not examined Respiratory: Effort normal.  No respiratory distress. No chest wall tenderness. Breath sounds normal.  No wheezes, rales or rhonchi.  Abdomen:  Sore in left upper quadrant over port GU:  Not examined.   Musculoskeletal: Normal range of motion. Extremities are nontender. No cyanosis, edema or clubbing noted Lymphadenopathy: No cervical, preauricular, postauricular or axillary adenopathy is present Skin: Skin is warm and dry. No rash noted. No diaphoresis. No erythema. No pallor. Pscyh: Normal mood and affect. Behavior is normal. Judgment and thought content normal.   LABORATORY RESULTS: Results for orders placed or performed during the hospital encounter of 02/13/16 (from the past 48 hour(s))  CBC WITH DIFFERENTIAL     Status: None   Collection Time: 02/13/16  8:30 AM  Result Value Ref Range     WBC 9.0 4.0 - 10.5 K/uL   RBC 4.48 3.87 - 5.11 MIL/uL   Hemoglobin 13.2 12.0 - 15.0 g/dL   HCT 41.3 36.0 - 46.0 %   MCV 92.2 78.0 - 100.0 fL   MCH 29.5 26.0 - 34.0 pg   MCHC 32.0 30.0 - 36.0 g/dL   RDW 14.1 11.5 - 15.5 %   Platelets 289 150 - 400 K/uL   Neutrophils Relative % 71 %   Neutro Abs 6.4 1.7 - 7.7 K/uL   Lymphocytes Relative 21 %   Lymphs Abs 1.9 0.7 - 4.0 K/uL   Monocytes Relative 7 %   Monocytes Absolute 0.6 0.1 - 1.0 K/uL   Eosinophils Relative 1 %   Eosinophils Absolute 0.1 0.0 - 0.7 K/uL   Basophils Relative 0 %   Basophils Absolute 0.0 0.0 - 0.1 K/uL  Comprehensive metabolic panel      Status: Abnormal   Collection Time: 02/13/16  8:30 AM  Result Value Ref Range   Sodium 144 135 - 145 mmol/L   Potassium 4.4 3.5 - 5.1 mmol/L   Chloride 105 101 - 111 mmol/L   CO2 28 22 - 32 mmol/L   Glucose, Bld 126 (H) 65 - 99 mg/dL   BUN 23 (H) 6 - 20 mg/dL   Creatinine, Ser 0.74 0.44 - 1.00 mg/dL   Calcium 10.1 8.9 - 10.3 mg/dL   Total Protein 7.4 6.5 - 8.1 g/dL   Albumin 4.3 3.5 - 5.0 g/dL   AST 34 15 - 41 U/L   ALT 26 14 - 54 U/L   Alkaline Phosphatase 80 38 - 126 U/L   Total Bilirubin 0.6 0.3 - 1.2 mg/dL   GFR calc non Af Amer >60 >60 mL/min   GFR calc Af Amer >60 >60 mL/min    Comment: (NOTE) The eGFR has been calculated using the CKD EPI equation. This calculation has not been validated in all clinical situations. eGFR's persistently <60 mL/min signify possible Chronic Kidney Disease.    Anion gap 11 5 - 15     RADIOLOGY RESULTS: Ct Head W Wo Contrast  02/12/2016  CLINICAL DATA:  Sharp pain in the right anterior head above the right eye for 4 days. EXAM: CT HEAD WITHOUT AND WITH CONTRAST TECHNIQUE: Contiguous axial images were obtained from the base of the skull through the vertex without and with intravenous contrast CONTRAST:  45m ISOVUE-300 IOPAMIDOL (ISOVUE-300) INJECTION 61% [place] [address] Results: Creatinine 0.7 mg/dL. COMPARISON:  None. FINDINGS: Skull and Sinuses:Negative for fracture or destructive process. The visualized mastoids, middle ears, and imaged paranasal sinuses are clear. Visualized orbits: Negative. Brain: No evidence of acute infarction, hemorrhage, hydrocephalus, or mass lesion/mass effect. Normal cerebral volume and white matter. Intracranial atherosclerosis, most prominent in the vertebral arteries. No abnormal intracranial enhancement. Major intracranial vessels opacify. IMPRESSION: 1. No acute finding or explanation for headache. 2. Intracranial atherosclerosis. Electronically Signed    By: JMonte FantasiaM.D.   On: 02/12/2016 16:13    Problem List: Patient Active Problem List   Diagnosis Date Noted  . Obesity 08/04/2013  . Hyperlipidemia 08/04/2013  . Chest pain 08/04/2013  [place]  . Hypertension 10/17/2012    Assessment & Plan: For removal of lapband and sleeve gastrectomy    Matt B. MHassell Done MD, [facility] [phone] 02/14/2016 1:57 PM

## 2016-02-15 ENCOUNTER — Emergency Department (HOSPITAL_COMMUNITY)
Admission: EM | Admit: 2016-02-15 | Discharge: 2016-02-15 | Disposition: A | Discharge status: Home / Self Care | Payer: BLUE CROSS/BLUE SHIELD | Attending: Emergency Medicine | Admitting: Emergency Medicine

## 2016-02-15 ENCOUNTER — Encounter (HOSPITAL_COMMUNITY): Payer: Self-pay | Admitting: Emergency Medicine

## 2016-02-15 ENCOUNTER — Emergency Department (HOSPITAL_COMMUNITY): Payer: BLUE CROSS/BLUE SHIELD

## 2016-02-15 DIAGNOSIS — M1990 Unspecified osteoarthritis, unspecified site: Secondary | ICD-10-CM | POA: Diagnosis not present

## 2016-02-15 DIAGNOSIS — J45909 Unspecified asthma, uncomplicated: Secondary | ICD-10-CM | POA: Diagnosis not present

## 2016-02-15 DIAGNOSIS — Z8659 Personal history of other mental and behavioral disorders: Secondary | ICD-10-CM | POA: Insufficient documentation

## 2016-02-15 DIAGNOSIS — Z8669 Personal history of other diseases of the nervous system and sense organs: Secondary | ICD-10-CM | POA: Insufficient documentation

## 2016-02-15 DIAGNOSIS — R51 Headache: Secondary | ICD-10-CM | POA: Insufficient documentation

## 2016-02-15 DIAGNOSIS — I10 Essential (primary) hypertension: Secondary | ICD-10-CM | POA: Diagnosis not present

## 2016-02-15 DIAGNOSIS — R519 Headache, unspecified: Secondary | ICD-10-CM

## 2016-02-15 DIAGNOSIS — Z79899 Other long term (current) drug therapy: Secondary | ICD-10-CM | POA: Insufficient documentation

## 2016-02-15 LAB — CBC WITH DIFFERENTIAL/PLATELET
BASOS PCT: 0 %
Basophils Absolute: 0 10*3/uL (ref 0.0–0.1)
EOS ABS: 0.1 10*3/uL (ref 0.0–0.7)
EOS PCT: 1 %
HCT: 40.9 % (ref 36.0–46.0)
HEMOGLOBIN: 13.9 g/dL (ref 12.0–15.0)
Lymphocytes Relative: 30 %
Lymphs Abs: 2.5 10*3/uL (ref 0.7–4.0)
MCH: 30.7 pg (ref 26.0–34.0)
MCHC: 34 g/dL (ref 30.0–36.0)
MCV: 90.3 fL (ref 78.0–100.0)
Monocytes Absolute: 0.7 10*3/uL (ref 0.1–1.0)
Monocytes Relative: 9 %
NEUTROS PCT: 60 %
Neutro Abs: 4.8 10*3/uL (ref 1.7–7.7)
PLATELETS: 273 10*3/uL (ref 150–400)
RBC: 4.53 MIL/uL (ref 3.87–5.11)
RDW: 14.1 % (ref 11.5–15.5)
WBC: 8.1 10*3/uL (ref 4.0–10.5)

## 2016-02-15 LAB — BASIC METABOLIC PANEL
Anion gap: 12 (ref 5–15)
BUN: 24 mg/dL — AB (ref 6–20)
CHLORIDE: 104 mmol/L (ref 101–111)
CO2: 25 mmol/L (ref 22–32)
CREATININE: 0.77 mg/dL (ref 0.44–1.00)
Calcium: 9.4 mg/dL (ref 8.9–10.3)
Glucose, Bld: 89 mg/dL (ref 65–99)
POTASSIUM: 3.4 mmol/L — AB (ref 3.5–5.1)
SODIUM: 141 mmol/L (ref 135–145)

## 2016-02-15 LAB — SEDIMENTATION RATE: SED RATE: 19 mm/h (ref 0–22)

## 2016-02-15 MED ORDER — METOCLOPRAMIDE HCL 5 MG/ML IJ SOLN
10.0000 mg | Freq: Once | INTRAMUSCULAR | Status: AC
  Administered 2016-02-15: 10 mg via INTRAVENOUS
  Filled 2016-02-15: qty 2

## 2016-02-15 MED ORDER — SODIUM CHLORIDE 0.9 % IV BOLUS (SEPSIS)
1000.0000 mL | Freq: Once | INTRAVENOUS | Status: AC
  Administered 2016-02-15: 1000 mL via INTRAVENOUS

## 2016-02-15 MED ORDER — LORAZEPAM 2 MG/ML IJ SOLN
1.0000 mg | Freq: Once | INTRAMUSCULAR | Status: AC
  Administered 2016-02-15: 1 mg via INTRAVENOUS
  Filled 2016-02-15: qty 1

## 2016-02-15 MED ORDER — KETOROLAC TROMETHAMINE 30 MG/ML IJ SOLN
30.0000 mg | Freq: Once | INTRAMUSCULAR | Status: AC
  Administered 2016-02-15: 30 mg via INTRAVENOUS
  Filled 2016-02-15: qty 1

## 2016-02-15 MED ORDER — ONDANSETRON HCL 4 MG PO TABS: 4.0000 mg | ORAL_TABLET | Freq: Four times a day (QID) | ORAL | Status: DC

## 2016-02-15 MED ORDER — DIPHENHYDRAMINE HCL 50 MG/ML IJ SOLN
25.0000 mg | Freq: Once | INTRAMUSCULAR | Status: AC
  Administered 2016-02-15: 25 mg via INTRAVENOUS
  Filled 2016-02-15: qty 1

## 2016-02-15 MED ORDER — OXYCODONE-ACETAMINOPHEN 5-325 MG PO TABS: 1.0000 | ORAL_TABLET | ORAL | Status: DC | PRN

## 2016-02-15 NOTE — ED Provider Notes (Signed)
CSN: [id number]     Arrival date & time 02/15/16  1013 History   First MD Initiated Contact with Patient 02/15/16 1204     Chief Complaint  Patient presents with  . Headache     (Consider location/radiation/quality/duration/timing/severity/associated sxs/prior Treatment) HPI..... Right temporal headache intermittently for one week. No neurological deficits, visual deficits, stiff neck. She is taken over-the-counter medications with minimal relief. CT of the head was negative on Wednesday. Her primary care doctor has been discussing the possibility of an MRI scan. Severity of symptoms is moderate. Nothing makes symptoms better or worse.  Past Medical History  Diagnosis Date  . Arthritis   . Hypertension   . Asthma   . PONV (postoperative nausea and vomiting)   . Sleep apnea     HX of sleep apnea not currently using c pap  . Claustrophobia    Past Surgical History  Procedure Laterality Date  . Abdominal hysterectomy    . Joint replacement      both knees  . Laparoscopic gastric banding     Family History  Problem Relation Age of Onset  . Stroke Mother   . Heart disease Mother   . Cancer Father     prostate  . Cancer Sister     colon   Social History  Substance Use Topics  . Smoking status: Never Smoker   . Smokeless tobacco: Never Used  . Alcohol Use: No   OB History    No data available     Review of Systems  All other systems reviewed and are negative.     Allergies  Morphine and related and Lipitor  Home Medications   Prior to Admission medications   Medication Sig Start Date End Date Taking? Authorizing Provider  acetaminophen (TYLENOL) 500 MG tablet Take 1,000 mg by mouth every 6 (six) hours as needed for mild pain, moderate pain or headache.   Yes Historical Provider, MD  albuterol (PROVENTIL HFA;VENTOLIN HFA) 108 (90 Base) MCG/ACT inhaler Inhale 1 puff into the lungs every 6 (six) hours as needed for wheezing or shortness of breath.   Yes Historical  Provider, MD  amLODipine (NORVASC) 2.5 MG tablet Take 2.5 mg by mouth at bedtime.    Yes Historical Provider, MD  aspirin-acetaminophen-caffeine (EXCEDRIN MIGRAINE) [phone] MG tablet Take 2 tablets by mouth every 6 (six) hours as needed for headache or migraine.   Yes Historical Provider, MD  hydrochlorothiazide (HYDRODIURIL) 25 MG tablet Take 25 mg by mouth every evening.    Yes Historical Provider, MD  lisinopril (PRINIVIL,ZESTRIL) 30 MG tablet Take 30 mg by mouth at bedtime.    Yes Historical Provider, MD  Multiple Vitamin (MULTIVITAMIN WITH MINERALS) TABS tablet Take 1 tablet by mouth daily.   Yes Historical Provider, MD  traMADol (ULTRAM) 50 MG tablet Take 50 mg by mouth every 6 (six) hours as needed for moderate pain or severe pain.   Yes Historical Provider, MD  zolpidem (AMBIEN CR) 12.5 MG CR tablet Take 12.5 mg by mouth at bedtime.    Yes Historical Provider, MD  ondansetron (ZOFRAN) 4 MG tablet Take 1 tablet (4 mg total) by mouth every 6 (six) hours. 02/15/16   Donnetta Hutching, MD  oxyCODONE-acetaminophen (PERCOCET/ROXICET) 5-325 MG tablet Take 1-2 tablets by mouth every 4 (four) hours as needed for severe pain. 02/15/16   Donnetta Hutching, MD   BP 119/73 mmHg  Pulse 94  Temp(Src) 98.1 F (36.7 C) (Oral)  Resp 16  SpO2 98% Physical Exam  Constitutional: She is oriented to person, place, and time. She appears well-developed and well-nourished.  HENT:  Head: Normocephalic and atraumatic.  Tender in the right temporal area  Eyes: Conjunctivae and EOM are normal. Pupils are equal, round, and reactive to light.  Neck: Normal range of motion. Neck supple.  Cardiovascular: Normal rate and regular rhythm.   Pulmonary/Chest: Effort normal and breath sounds normal.  Abdominal: Soft. Bowel sounds are normal.  Musculoskeletal: Normal range of motion.  Neurological: She is alert and oriented to person, place, and time.  Skin: Skin is warm and dry.  Psychiatric: She has a normal mood and affect. Her  behavior is normal.  Nursing note and vitals reviewed.   ED Course  Procedures (including critical care time) Labs Review Labs Reviewed  BASIC METABOLIC PANEL - Abnormal; Notable for the following:    Potassium 3.4 (*)    BUN 24 (*)    All other components within normal limits  CBC WITH DIFFERENTIAL/PLATELET  SEDIMENTATION RATE    Imaging Review Mr Brain Wo Contrast (neuro Protocol)  02/15/2016  CLINICAL DATA:  RIGHT temporal headache and RIGHT eye pain for the past week. EXAM: MRI HEAD WITHOUT CONTRAST TECHNIQUE: Multiplanar, multiecho pulse sequences of the brain and surrounding structures were obtained without intravenous contrast. COMPARISON:  CT head 02/12/2016. FINDINGS: No evidence for acute infarction, hemorrhage, mass lesion, hydrocephalus, or extra-axial fluid. Normal for age cerebral volume. No white matter disease. Pituitary, pineal, and cerebellar tonsils unremarkable. No upper cervical lesions. Flow voids are maintained throughout the carotid, basilar, and vertebral arteries. There are no areas of chronic hemorrhage. Negative visualized orbits, sinuses, and mastoids. Scalp soft tissues unremarkable. IMPRESSION: Negative exam. No change from prior CT. No acute intracranial findings. Electronically Signed   By: Elsie Stain M.D.   On: 02/15/2016 15:00   I have personally reviewed and evaluated these images and lab results as part of my medical decision-making.   EKG Interpretation None      MDM   Final diagnoses:  Acute intractable headache, unspecified headache type    Diagnosis of temporal arteritis entertained. Sedimentation rate was normal. MRI of the head was negative. No neurological deficits. Discharge medications Percocet and Zofran 8 mg    Donnetta Hutching, MD 02/15/16 1544

## 2016-02-15 NOTE — Discharge Instructions (Signed)
MRI scan showed no serious problems. Increase fluids. Medication for pain and nausea. Follow-up your primary care doctor

## 2016-02-15 NOTE — ED Notes (Signed)
Patient transported to MRI 

## 2016-02-15 NOTE — ED Notes (Signed)
Unable to draw back more than 2ml blood from the IV start.  Flushes fine.

## 2016-02-15 NOTE — ED Notes (Signed)
Pt reports R temporal HA and R eye pain for the past week. Saw MD for same earlier in the week as part of per-op workup (gastric surgery scheduled for March). Pt took excedrin for pain which has decreased pain, but not completely relieved it. Pt was told to come here if pain persists.

## 2016-02-18 MED FILL — oxyCODONE HCL 5 MG/5ML SOLN: 5 | 3 days supply | Qty: 200 | Fill #0

## 2016-02-24 ENCOUNTER — Encounter (HOSPITAL_COMMUNITY)
Admission: RE | Disposition: A | Discharge status: Home / Self Care | Payer: Self-pay | Source: Ambulatory Visit | Attending: Surgery

## 2016-02-24 ENCOUNTER — Ambulatory Visit (HOSPITAL_COMMUNITY): Payer: BLUE CROSS/BLUE SHIELD | Admitting: Certified Registered"

## 2016-02-24 ENCOUNTER — Inpatient Hospital Stay (HOSPITAL_COMMUNITY)
Admission: RE | Admit: 2016-02-24 | Discharge: 2016-02-26 | DRG: 327 | Disposition: A | Discharge status: Home / Self Care | Payer: BLUE CROSS/BLUE SHIELD | Source: Ambulatory Visit | Attending: Surgery | Admitting: Surgery

## 2016-02-24 ENCOUNTER — Encounter (HOSPITAL_COMMUNITY): Payer: Self-pay | Admitting: *Deleted

## 2016-02-24 DIAGNOSIS — K660 Peritoneal adhesions (postprocedural) (postinfection): Secondary | ICD-10-CM | POA: Diagnosis present

## 2016-02-24 DIAGNOSIS — Z6841 Body Mass Index (BMI) 40.0 and over, adult: Secondary | ICD-10-CM | POA: Diagnosis not present

## 2016-02-24 DIAGNOSIS — R110 Nausea: Secondary | ICD-10-CM | POA: Diagnosis not present

## 2016-02-24 DIAGNOSIS — G4730 Sleep apnea, unspecified: Secondary | ICD-10-CM | POA: Diagnosis present

## 2016-02-24 DIAGNOSIS — K9509 Other complications of gastric band procedure: Principal | ICD-10-CM | POA: Diagnosis present

## 2016-02-24 DIAGNOSIS — Z8249 Family history of ischemic heart disease and other diseases of the circulatory system: Secondary | ICD-10-CM

## 2016-02-24 DIAGNOSIS — Z9884 Bariatric surgery status: Secondary | ICD-10-CM

## 2016-02-24 DIAGNOSIS — Y848 Other medical procedures as the cause of abnormal reaction of the patient, or of later complication, without mention of misadventure at the time of the procedure: Secondary | ICD-10-CM | POA: Diagnosis present

## 2016-02-24 DIAGNOSIS — Z01812 Encounter for preprocedural laboratory examination: Secondary | ICD-10-CM | POA: Diagnosis not present

## 2016-02-24 DIAGNOSIS — I10 Essential (primary) hypertension: Secondary | ICD-10-CM | POA: Diagnosis present

## 2016-02-24 DIAGNOSIS — K429 Umbilical hernia without obstruction or gangrene: Secondary | ICD-10-CM | POA: Diagnosis present

## 2016-02-24 DIAGNOSIS — Z96653 Presence of artificial knee joint, bilateral: Secondary | ICD-10-CM | POA: Diagnosis present

## 2016-02-24 DIAGNOSIS — Z823 Family history of stroke: Secondary | ICD-10-CM | POA: Diagnosis not present

## 2016-02-24 HISTORY — PX: LAPAROSCOPIC LYSIS OF ADHESIONS: SHX5905

## 2016-02-24 HISTORY — PX: UMBILICAL HERNIA REPAIR: SHX196

## 2016-02-24 HISTORY — PX: LAPAROSCOPIC GASTRIC BAND REMOVAL WITH LAPAROSCOPIC GASTRIC SLEEVE RESECTION: SHX6498

## 2016-02-24 LAB — CREATININE, SERUM: CREATININE: 0.89 mg/dL (ref 0.44–1.00)

## 2016-02-24 LAB — CBC
HEMATOCRIT: 37.4 % (ref 36.0–46.0)
HEMOGLOBIN: 12.4 g/dL (ref 12.0–15.0)
MCH: 30.2 pg (ref 26.0–34.0)
MCHC: 33.2 g/dL (ref 30.0–36.0)
MCV: 91.2 fL (ref 78.0–100.0)
Platelets: 290 10*3/uL (ref 150–400)
RBC: 4.1 MIL/uL (ref 3.87–5.11)
RDW: 14.1 % (ref 11.5–15.5)
WBC: 14.9 10*3/uL — ABNORMAL HIGH (ref 4.0–10.5)

## 2016-02-24 LAB — HEMOGLOBIN AND HEMATOCRIT, BLOOD
HEMATOCRIT: 37.5 % (ref 36.0–46.0)
Hemoglobin: 12.6 g/dL (ref 12.0–15.0)

## 2016-02-24 SURGERY — LAPAROSCOPIC GASTRIC BAND REMOVAL WITH LAPAROSCOPIC GASTRIC SLEEVE RESECTION
Anesthesia: General

## 2016-02-24 MED ORDER — ONDANSETRON HCL 4 MG/2ML IJ SOLN
4.0000 mg | INTRAMUSCULAR | Status: DC | PRN
  Administered 2016-02-24 (×2): 4 mg via INTRAVENOUS
  Filled 2016-02-24 (×2): qty 2

## 2016-02-24 MED ORDER — HEPARIN SODIUM (PORCINE) 5000 UNIT/ML IJ SOLN
5000.0000 [IU] | INTRAMUSCULAR | Status: AC
  Administered 2016-02-24: 5000 [IU] via SUBCUTANEOUS
  Filled 2016-02-24: qty 1

## 2016-02-24 MED ORDER — PROPOFOL 10 MG/ML IV BOLUS
INTRAVENOUS | Status: AC
  Filled 2016-02-24: qty 40

## 2016-02-24 MED ORDER — PHENYLEPHRINE 40 MCG/ML (10ML) SYRINGE FOR IV PUSH (FOR BLOOD PRESSURE SUPPORT)
PREFILLED_SYRINGE | INTRAVENOUS | Status: AC
  Filled 2016-02-24: qty 10

## 2016-02-24 MED ORDER — DEXTROSE 5 % IV SOLN
INTRAVENOUS | Status: AC
  Filled 2016-02-24: qty 2

## 2016-02-24 MED ORDER — PROPOFOL 10 MG/ML IV BOLUS
INTRAVENOUS | Status: DC | PRN
  Administered 2016-02-24: 160 mg via INTRAVENOUS
  Administered 2016-02-24: 50 mg via INTRAVENOUS

## 2016-02-24 MED ORDER — FENTANYL CITRATE (PF) 250 MCG/5ML IJ SOLN
INTRAMUSCULAR | Status: AC
  Filled 2016-02-24: qty 5

## 2016-02-24 MED ORDER — HEPARIN SODIUM (PORCINE) 5000 UNIT/ML IJ SOLN
5000.0000 [IU] | Freq: Three times a day (TID) | INTRAMUSCULAR | Status: DC
  Administered 2016-02-24 – 2016-02-26 (×5): 5000 [IU] via SUBCUTANEOUS
  Filled 2016-02-24 (×8): qty 1

## 2016-02-24 MED ORDER — CHLORHEXIDINE GLUCONATE CLOTH 2 % EX PADS: 6.0000 | MEDICATED_PAD | Freq: Once | CUTANEOUS | Status: DC

## 2016-02-24 MED ORDER — NEOSTIGMINE METHYLSULFATE 10 MG/10ML IV SOLN
INTRAVENOUS | Status: AC
  Filled 2016-02-24: qty 1

## 2016-02-24 MED ORDER — DEXAMETHASONE SODIUM PHOSPHATE 10 MG/ML IJ SOLN
INTRAMUSCULAR | Status: DC | PRN
  Administered 2016-02-24: 10 mg via INTRAVENOUS

## 2016-02-24 MED ORDER — SODIUM CHLORIDE 0.9 % IJ SOLN
INTRAMUSCULAR | Status: DC | PRN
  Administered 2016-02-24: 10 mL via INTRAVENOUS

## 2016-02-24 MED ORDER — MIDAZOLAM HCL 2 MG/2ML IJ SOLN
INTRAMUSCULAR | Status: DC | PRN
  Administered 2016-02-24: 2 mg via INTRAVENOUS

## 2016-02-24 MED ORDER — KCL IN DEXTROSE-NACL 20-5-0.45 MEQ/L-%-% IV SOLN
INTRAVENOUS | Status: DC
  Administered 2016-02-24: 13:00:00 via INTRAVENOUS
  Administered 2016-02-24: 1000 mL via INTRAVENOUS
  Administered 2016-02-25 – 2016-02-26 (×2): via INTRAVENOUS
  Filled 2016-02-24 (×6): qty 1000

## 2016-02-24 MED ORDER — ROCURONIUM BROMIDE 100 MG/10ML IV SOLN
INTRAVENOUS | Status: AC
  Filled 2016-02-24: qty 1

## 2016-02-24 MED ORDER — 0.9 % SODIUM CHLORIDE (POUR BTL) OPTIME
TOPICAL | Status: DC | PRN
  Administered 2016-02-24: 1000 mL

## 2016-02-24 MED ORDER — KCL IN DEXTROSE-NACL 20-5-0.45 MEQ/L-%-% IV SOLN
INTRAVENOUS | Status: AC
  Filled 2016-02-24: qty 1000

## 2016-02-24 MED ORDER — SODIUM CHLORIDE 0.9 % IJ SOLN
INTRAMUSCULAR | Status: AC
  Filled 2016-02-24: qty 20

## 2016-02-24 MED ORDER — ONDANSETRON HCL 4 MG/2ML IJ SOLN
INTRAMUSCULAR | Status: DC | PRN
  Administered 2016-02-24: 4 mg via INTRAVENOUS

## 2016-02-24 MED ORDER — SCOPOLAMINE 1 MG/3DAYS TD PT72
MEDICATED_PATCH | TRANSDERMAL | Status: AC
  Filled 2016-02-24: qty 1

## 2016-02-24 MED ORDER — SUGAMMADEX SODIUM 500 MG/5ML IV SOLN
INTRAVENOUS | Status: AC
  Filled 2016-02-24: qty 5

## 2016-02-24 MED ORDER — OXYCODONE HCL 5 MG/5ML PO SOLN
5.0000 mg | ORAL | Status: DC | PRN
Start: 2016-02-25 — End: 2016-02-26
  Administered 2016-02-25 (×2): 10 mg via ORAL
  Filled 2016-02-24 (×2): qty 10

## 2016-02-24 MED ORDER — SCOPOLAMINE 1 MG/3DAYS TD PT72
MEDICATED_PATCH | TRANSDERMAL | Status: DC | PRN
  Administered 2016-02-24: 1.5 mg via TRANSDERMAL

## 2016-02-24 MED ORDER — BUPIVACAINE LIPOSOME 1.3 % IJ SUSP
20.0000 mL | Freq: Once | INTRAMUSCULAR | Status: AC
  Administered 2016-02-24: 20 mL
  Filled 2016-02-24: qty 20

## 2016-02-24 MED ORDER — DEXAMETHASONE SODIUM PHOSPHATE 10 MG/ML IJ SOLN
INTRAMUSCULAR | Status: AC
  Filled 2016-02-24: qty 1

## 2016-02-24 MED ORDER — ALBUTEROL SULFATE (2.5 MG/3ML) 0.083% IN NEBU: 2.5000 mg | INHALATION_SOLUTION | Freq: Four times a day (QID) | RESPIRATORY_TRACT | Status: DC | PRN

## 2016-02-24 MED ORDER — LACTATED RINGERS IR SOLN
Status: DC | PRN
  Administered 2016-02-24: 1000 mL

## 2016-02-24 MED ORDER — OXYCODONE HCL 5 MG PO TABS: 5.0000 mg | ORAL_TABLET | Freq: Once | ORAL | Status: DC | PRN

## 2016-02-24 MED ORDER — ACETAMINOPHEN 160 MG/5ML PO SOLN: 325.0000 mg | ORAL | Status: DC | PRN

## 2016-02-24 MED ORDER — ACETAMINOPHEN 160 MG/5ML PO SOLN: 650.0000 mg | ORAL | Status: DC | PRN

## 2016-02-24 MED ORDER — HYDROMORPHONE HCL 1 MG/ML IJ SOLN
INTRAMUSCULAR | Status: AC
  Filled 2016-02-24: qty 1

## 2016-02-24 MED ORDER — MORPHINE SULFATE (PF) 2 MG/ML IV SOLN: 2.0000 mg | INTRAVENOUS | Status: DC | PRN

## 2016-02-24 MED ORDER — LIDOCAINE HCL (CARDIAC) 20 MG/ML IV SOLN
INTRAVENOUS | Status: AC
  Filled 2016-02-24: qty 5

## 2016-02-24 MED ORDER — PROMETHAZINE HCL 25 MG/ML IJ SOLN
12.5000 mg | INTRAMUSCULAR | Status: DC | PRN
  Administered 2016-02-24 – 2016-02-25 (×2): 12.5 mg via INTRAVENOUS
  Filled 2016-02-24 (×2): qty 1

## 2016-02-24 MED ORDER — PHENYLEPHRINE HCL 10 MG/ML IJ SOLN
INTRAMUSCULAR | Status: DC | PRN
  Administered 2016-02-24 (×4): 80 ug via INTRAVENOUS

## 2016-02-24 MED ORDER — ACETAMINOPHEN 325 MG PO TABS: 325.0000 mg | ORAL_TABLET | ORAL | Status: DC | PRN

## 2016-02-24 MED ORDER — PANTOPRAZOLE SODIUM 40 MG IV SOLR
40.0000 mg | Freq: Every day | INTRAVENOUS | Status: DC
  Administered 2016-02-24 – 2016-02-25 (×2): 40 mg via INTRAVENOUS
  Filled 2016-02-24 (×3): qty 40

## 2016-02-24 MED ORDER — FENTANYL CITRATE (PF) 250 MCG/5ML IJ SOLN
INTRAMUSCULAR | Status: DC | PRN
  Administered 2016-02-24 (×10): 50 ug via INTRAVENOUS

## 2016-02-24 MED ORDER — ONDANSETRON HCL 4 MG/2ML IJ SOLN
INTRAMUSCULAR | Status: AC
  Filled 2016-02-24: qty 2

## 2016-02-24 MED ORDER — PROMETHAZINE HCL 25 MG/ML IJ SOLN
INTRAMUSCULAR | Status: AC
Start: 2016-02-24 — End: 2016-02-25
  Filled 2016-02-24: qty 1

## 2016-02-24 MED ORDER — PREMIER PROTEIN SHAKE
2.0000 [oz_av] | Freq: Four times a day (QID) | ORAL | Status: DC
  Administered 2016-02-26 (×3): 2 [oz_av] via ORAL

## 2016-02-24 MED ORDER — PROMETHAZINE HCL 25 MG/ML IJ SOLN
6.2500 mg | INTRAMUSCULAR | Status: DC | PRN
  Administered 2016-02-24: 12.5 mg via INTRAVENOUS

## 2016-02-24 MED ORDER — LACTATED RINGERS IV SOLN
INTRAVENOUS | Status: DC | PRN
  Administered 2016-02-24 (×2): via INTRAVENOUS

## 2016-02-24 MED ORDER — ROCURONIUM BROMIDE 100 MG/10ML IV SOLN
INTRAVENOUS | Status: DC | PRN
  Administered 2016-02-24 (×3): 10 mg via INTRAVENOUS
  Administered 2016-02-24: 50 mg via INTRAVENOUS
  Administered 2016-02-24 (×4): 10 mg via INTRAVENOUS

## 2016-02-24 MED ORDER — GLYCOPYRROLATE 0.2 MG/ML IJ SOLN
INTRAMUSCULAR | Status: AC
  Filled 2016-02-24: qty 3

## 2016-02-24 MED ORDER — OXYCODONE HCL 5 MG/5ML PO SOLN
5.0000 mg | Freq: Once | ORAL | Status: DC | PRN
  Filled 2016-02-24: qty 5

## 2016-02-24 MED ORDER — HYDROMORPHONE HCL 1 MG/ML IJ SOLN
0.5000 mg | INTRAMUSCULAR | Status: DC | PRN
  Administered 2016-02-24 – 2016-02-25 (×4): 0.5 mg via INTRAVENOUS
  Filled 2016-02-24 (×4): qty 1

## 2016-02-24 MED ORDER — LIDOCAINE HCL (CARDIAC) 20 MG/ML IV SOLN
INTRAVENOUS | Status: DC | PRN
  Administered 2016-02-24: 50 mg via INTRATRACHEAL

## 2016-02-24 MED ORDER — MIDAZOLAM HCL 2 MG/2ML IJ SOLN
INTRAMUSCULAR | Status: AC
  Filled 2016-02-24: qty 2

## 2016-02-24 MED ORDER — HYDROMORPHONE HCL 1 MG/ML IJ SOLN
0.2500 mg | INTRAMUSCULAR | Status: DC | PRN
  Administered 2016-02-24 (×4): 0.25 mg via INTRAVENOUS
  Administered 2016-02-24: 0.5 mg via INTRAVENOUS

## 2016-02-24 MED ORDER — DEXTROSE 5 % IV SOLN
2.0000 g | INTRAVENOUS | Status: AC
  Administered 2016-02-24: 2 g via INTRAVENOUS

## 2016-02-24 MED ORDER — SUGAMMADEX SODIUM 500 MG/5ML IV SOLN
INTRAVENOUS | Status: DC | PRN
  Administered 2016-02-24: 500 mg via INTRAVENOUS

## 2016-02-24 SURGICAL SUPPLY — 63 items
APPLICATOR COTTON TIP 6IN STRL (MISCELLANEOUS) IMPLANT
APPLIER CLIP ROT 10 11.4 M/L (STAPLE)
APPLIER CLIP ROT 13.4 12 LRG (CLIP)
BLADE HEX COATED 2.75 (ELECTRODE) ×4 IMPLANT
BLADE SURG 15 STRL LF DISP TIS (BLADE) ×2 IMPLANT
BLADE SURG 15 STRL SS (BLADE) ×2
CABLE HIGH FREQUENCY MONO STRZ (ELECTRODE) ×4 IMPLANT
CLIP APPLIE ROT 10 11.4 M/L (STAPLE) IMPLANT
CLIP APPLIE ROT 13.4 12 LRG (CLIP) IMPLANT
COVER SURGICAL LIGHT HANDLE (MISCELLANEOUS) ×4 IMPLANT
DEVICE SUT QUICK LOAD TK 5 (STAPLE) IMPLANT
DEVICE SUT TI-KNOT TK 5X26 (MISCELLANEOUS) IMPLANT
DEVICE SUTURE ENDOST 10MM (ENDOMECHANICALS) IMPLANT
DEVICE TI KNOT TK5 (MISCELLANEOUS)
DEVICE TROCAR PUNCTURE CLOSURE (ENDOMECHANICALS) ×4 IMPLANT
ELECT PENCIL ROCKER SW 15FT (MISCELLANEOUS) ×4 IMPLANT
ELECT REM PT RETURN 9FT ADLT (ELECTROSURGICAL) ×4
ELECTRODE REM PT RTRN 9FT ADLT (ELECTROSURGICAL) ×2 IMPLANT
EVACUATOR SILICONE 100CC (DRAIN) IMPLANT
GAUZE SPONGE 4X4 12PLY STRL (GAUZE/BANDAGES/DRESSINGS) IMPLANT
GLOVE BIOGEL M 8.0 STRL (GLOVE) ×4 IMPLANT
GOWN STRL REUS W/TWL XL LVL3 (GOWN DISPOSABLE) ×16 IMPLANT
HANDLE STAPLE EGIA 4 XL (STAPLE) ×4 IMPLANT
HOVERMATT SINGLE USE (MISCELLANEOUS) ×4 IMPLANT
KIT BASIN OR (CUSTOM PROCEDURE TRAY) ×4 IMPLANT
LIQUID BAND (GAUZE/BANDAGES/DRESSINGS) IMPLANT
NEEDLE SPNL 22GX3.5 QUINCKE BK (NEEDLE) ×4 IMPLANT
PACK UNIVERSAL I (CUSTOM PROCEDURE TRAY) ×4 IMPLANT
POUCH SPECIMEN RETRIEVAL 10MM (ENDOMECHANICALS) ×4 IMPLANT
QUICK LOAD TK 5 (STAPLE)
RELOAD ENDO STITCH (ENDOMECHANICALS) IMPLANT
RELOAD TRI 45 ART MED THCK BLK (STAPLE) ×4 IMPLANT
RELOAD TRI 45 ART MED THCK PUR (STAPLE) IMPLANT
RELOAD TRI 60 ART MED THCK BLK (STAPLE) ×20 IMPLANT
RELOAD TRI 60 ART MED THCK PUR (STAPLE) IMPLANT
SCISSORS LAP 5X45 EPIX DISP (ENDOMECHANICALS) ×4 IMPLANT
SCRUB PCMX 4 OZ (MISCELLANEOUS) ×8 IMPLANT
SEALANT SURGICAL APPL DUAL CAN (MISCELLANEOUS) ×4 IMPLANT
SET IRRIG TUBING LAPAROSCOPIC (IRRIGATION / IRRIGATOR) ×4 IMPLANT
SHEARS HARMONIC ACE PLUS 45CM (MISCELLANEOUS) ×4 IMPLANT
SLEEVE ADV FIXATION 12X100MM (TROCAR) IMPLANT
SLEEVE ADV FIXATION 5X100MM (TROCAR) ×12 IMPLANT
SLEEVE GASTRECTOMY 36FR VISIGI (MISCELLANEOUS) ×4 IMPLANT
SOLUTION ANTI FOG 6CC (MISCELLANEOUS) ×4 IMPLANT
SPONGE LAP 18X18 X RAY DECT (DISPOSABLE) ×4 IMPLANT
STAPLER VISISTAT 35W (STAPLE) ×4 IMPLANT
SUT ETHILON 2 0 PS N (SUTURE) IMPLANT
SUT VIC AB 4-0 SH 18 (SUTURE) ×4 IMPLANT
SUT VICRYL 0 TIES 12 18 (SUTURE) ×8 IMPLANT
SYR 20CC LL (SYRINGE) ×4 IMPLANT
SYR 50ML LL SCALE MARK (SYRINGE) ×4 IMPLANT
TOWEL OR 17X26 10 PK STRL BLUE (TOWEL DISPOSABLE) ×8 IMPLANT
TOWEL OR NON WOVEN STRL DISP B (DISPOSABLE) ×4 IMPLANT
TRAY FOLEY W/METER SILVER 14FR (SET/KITS/TRAYS/PACK) IMPLANT
TRAY FOLEY W/METER SILVER 16FR (SET/KITS/TRAYS/PACK) IMPLANT
TROCAR ADV FIXATION 12X100MM (TROCAR) ×4 IMPLANT
TROCAR ADV FIXATION 5X100MM (TROCAR) ×4 IMPLANT
TROCAR BLADELESS 15MM (ENDOMECHANICALS) ×4 IMPLANT
TROCAR BLADELESS OPT 5 100 (ENDOMECHANICALS) ×4 IMPLANT
TUBING CONNECTING 10 (TUBING) ×3 IMPLANT
TUBING CONNECTING 10' (TUBING) ×1
TUBING ENDO SMARTCAP (MISCELLANEOUS) ×4 IMPLANT
TUBING INSUF HEATED (TUBING) ×4 IMPLANT

## 2016-02-24 NOTE — Anesthesia Preprocedure Evaluation (Signed)
Anesthesia Evaluation  Patient identified by MRN, date of birth, ID band Patient awake    Reviewed: Allergy & Precautions, NPO status , Patient's Chart, lab work & pertinent test results  History of Anesthesia Complications (+) PONV and history of anesthetic complications  Airway Mallampati: II  TM Distance: >3 FB Neck ROM: Full    Dental  (+) Teeth Intact,    Pulmonary neg shortness of breath, asthma , sleep apnea , neg COPD, neg recent URI,    breath sounds clear to auscultation       Cardiovascular hypertension, Pt. on medications (-) angina(-) Past MI and (-) CHF  Rhythm:Regular     Neuro/Psych PSYCHIATRIC DISORDERS Anxiety negative neurological ROS     GI/Hepatic negative GI ROS, Neg liver ROS,   Endo/Other  Morbid obesity  Renal/GU negative Renal ROS     Musculoskeletal  (+) Arthritis ,   Abdominal   Peds  Hematology negative hematology ROS (+)   Anesthesia Other Findings   Reproductive/Obstetrics                             Anesthesia Physical Anesthesia Plan  ASA: III  Anesthesia Plan: General   Post-op Pain Management:    Induction: Intravenous  Airway Management Planned: Oral ETT  Additional Equipment: None  Intra-op Plan:   Post-operative Plan: Extubation in OR  Informed Consent: I have reviewed the patients History and Physical, chart, labs and discussed the procedure including the risks, benefits and alternatives for the proposed anesthesia with the patient or authorized representative who has indicated his/her understanding and acceptance.   Dental advisory given  Plan Discussed with: CRNA and Surgeon  Anesthesia Plan Comments:         Anesthesia Quick Evaluation

## 2016-02-24 NOTE — Transfer of Care (Signed)
Immediate Anesthesia Transfer of Care Note  Patient: Tonette BihariMarilyn Pilley  Procedure(s) Performed: Procedure(s): LAPAROSCOPIC GASTRIC BAND REMOVAL WITH LAPAROSCOPIC GASTRIC SLEEVE RESECTION (N/A) LAPAROSCOPIC LYSIS OF ADHESIONS HERNIA REPAIR UMBILICAL ADULT  Patient Location: PACU  Anesthesia Type:General  Level of Consciousness: awake and patient cooperative  Airway & Oxygen Therapy: Patient Spontanous Breathing and Patient connected to face mask oxygen  Post-op Assessment: Report given to RN and Post -op Vital signs reviewed and stable  Post vital signs: Reviewed and stable  Last Vitals:  Filed Vitals:   02/24/16 0518 02/24/16 1127  BP: 127/66 150/70  Pulse: 79 95  Temp: 36.2 C 36.6 C  Resp: 18 12    Complications: No apparent anesthesia complications

## 2016-02-24 NOTE — Op Note (Signed)
NAMEJULEEN, [name]              ACCOUNT NO.:  [id number]  MEDICAL RECORD NO.:  [id number]  LOCATION:  1524                         FACILITY:  [facility]  PHYSICIAN:  Thornton Park. Daphine Deutscher, MD  DATE OF BIRTH:  March 17, 1951  DATE OF PROCEDURE:  02/24/2016 DATE OF DISCHARGE:                              OPERATIVE REPORT   PREOPERATIVE DIAGNOSIS:  Dysfunctional lap band.  PROCEDURE:  Laparoscopy with 1 hour laparoscopic enterolysis involving small bowel stuck into a pelvic and abdominal rectus diastasis, takedown and removal of lap band, sleeve gastrectomy with ViSiGi in place, repair of umbilical hernia with single suture, upper endoscopy by Dr. Andrey Campanile.  DESCRIPTION OF PROCEDURE:  The patient was taken to room 4 at [facility] and given general anesthesia.  The abdomen was prepped with PCMX and draped sterilely.  I entered the abdomen through the left upper quadrant with a 5-mm Optiview without difficulty.  The patient did have a lot of upper abdominal obesity.  Following insufflation, we then found a loop of bowel that was stuck in a jack-knife position down in the pelvis.  Because of some of her symptoms you know of nausea and burping, I felt that we needed to take that down.  It was very stuck and what appeared to be a ventral diastasis of the rectus muscles.  I used sharp dissection to take this down in the midline.  Subsequent to that, we did see a small umbilical hernia which toward the end of the case I closed with a single suture using 0 Vicryl and an Endo Close.  Meanwhile, we focused on the foregut.  I placed a Nathanson retractor and took down the foregut adhesions.  We worked our way upward and freed up the band, mobilized it and then unbuckled it.  I took down the plication in toto.  The band was then removed through and up-sized.  A 15 port placed to the right of midline for the subsequent sleeve.  There was no evidence of a band erosion.  We also went back and looked at  the small intestine and there was no evidence of any enterotomies.  The band and most of the tubing then were removed.  I then performed a sleeve gastrectomy, passed the ViSiGi into the antrum.  This was done after using a Harmonic Scalpel to take down all the short gastrics all the way up to the left crus and getting good mobility.  For about 5 cm back from the pylorus, I began the division with a 4.5 cm Covidien black load with TRS. I then carried it up with 6-mm blocks the rest away all with TRS to complete the sleeve gastrectomy.  The sleeve was removed.  We then closed the 12-mm port site and also the small umbilical hernia as I mentioned above.  Dr. Andrey Campanile performed upper endoscopy which showed good cylindrical geometric sleeve.  I went ahead and performed an explantation of the port in the remaining tubing.  The port sites were all injected with Exparel and they were then irrigated and closed with 4- 0 Vicryl and LiquiBand.  The patient tolerated the procedure well and was taken to the recovery room in satisfactory condition.  Thornton ParkMatthew B. Daphine DeutscherMartin, MD    MBM/MEDQ  D:  02/24/2016  T:  02/24/2016  Job:  [id number]

## 2016-02-24 NOTE — Op Note (Signed)
Rose BihariMarilyn Jackson [id number] Aug 01, 1951 02/24/2016  Preoperative diagnosis: morbid obesity; h/o adjustable gastric band placement  Postoperative diagnosis: Same   Procedure: upper endoscopy   Surgeon: Mary SellaEric M. Adyan Palau M.D., FACS   Anesthesia: Gen.   Indications for procedure: 65 y.o. year old female undergoing removing of adjustable gastric band with conversion to Laparoscopic Gastric Sleeve Resection and an EGD was requested to evaluate the new gastric sleeve.   Description of procedure: After we have completed the sleeve resection, I scrubbed out and obtained the Olympus endoscope. I gently placed endoscope in the patient's oropharynx and gently glided it down the esophagus without any difficulty under direct visualization. Once I was in the gastric sleeve, I insufflated the stomach with air. I was able to cannulate and advanced the scope through the gastric sleeve. There was a little gastric fundus left. I was able to cannulate the duodenum with ease. Dr. Daphine DeutscherMartin had placed saline in the upper abdomen. Upon further insufflation of the gastric sleeve there was no evidence of bubbles. GE junction located at 38 cm.  Upon further inspection of the gastric sleeve, the mucosa appeared normal. There is no evidence of any mucosal abnormality. The sleeve was widely patent at the angularis. There was no evidence of bleeding. The gastric sleeve was decompressed. The scope was withdrawn. The patient tolerated this portion of the procedure well. Please see Dr Ermalene SearingMartin's operative note for details regarding the laparoscopic gastric sleeve resection.   Mary SellaEric M. Andrey CampanileWilson, MD, FACS  General, Bariatric, & Minimally Invasive Surgery  [facility] [place]

## 2016-02-24 NOTE — Interval H&P Note (Signed)
History and Physical Interval Note:  02/24/2016 7:20 AM  [name]  has presented today for surgery, with the diagnosis of LAPBAND DYSFUNCTION  The various methods of treatment have been discussed with the patient and family. After consideration of risks, benefits and other options for treatment, the patient has consented to  Procedure(s): LAPAROSCOPIC GASTRIC BAND REMOVAL WITH LAPAROSCOPIC GASTRIC SLEEVE RESECTION (N/A) as a surgical intervention .  The patient's history has been reviewed, patient examined, no change in status, stable for surgery.  I have reviewed the patient's chart and labs.  Questions were answered to the patient's satisfaction.     Nadea Kirkland B

## 2016-02-24 NOTE — Anesthesia Postprocedure Evaluation (Signed)
Anesthesia Post Note  Patient: Rose BihariMarilyn Jackson  Procedure(s) Performed: Procedure(s) (LRB): LAPAROSCOPIC GASTRIC BAND REMOVAL WITH LAPAROSCOPIC GASTRIC SLEEVE RESECTION (N/A) LAPAROSCOPIC LYSIS OF ADHESIONS HERNIA REPAIR UMBILICAL ADULT  Patient location during evaluation: PACU Anesthesia Type: General Level of consciousness: awake Pain management: pain level controlled Vital Signs Assessment: post-procedure vital signs reviewed and stable Respiratory status: spontaneous breathing Cardiovascular status: stable Postop Assessment: no signs of nausea or vomiting Anesthetic complications: no    Last Vitals:  Filed Vitals:   02/24/16 1445 02/24/16 1600  BP: 148/86 156/73  Pulse: 87 81  Temp: 36.4 C 36.4 C  Resp: 14 14    Last Pain:  Filed Vitals:   02/24/16 1753  PainSc: 5                  Nesha Counihan

## 2016-02-24 NOTE — Anesthesia Procedure Notes (Signed)
Procedure Name: Intubation Date/Time: 02/24/2016 7:41 AM Performed by: Donna BernardRIMBLE, Lilley Hubble H Pre-anesthesia Checklist: Patient identified, Emergency Drugs available, Suction available, Patient being monitored and Timeout performed Patient Re-evaluated:Patient Re-evaluated prior to inductionOxygen Delivery Method: Circle system utilized Preoxygenation: Pre-oxygenation with 100% oxygen Intubation Type: IV induction Ventilation: Mask ventilation without difficulty Laryngoscope Size: Miller and 3 Grade View: Grade I Tube type: Oral Tube size: 7.0 mm Number of attempts: 1 Airway Equipment and Method: Stylet Placement Confirmation: positive ETCO2,  ETT inserted through vocal cords under direct vision,  CO2 detector and breath sounds checked- equal and bilateral Secured at: 23 cm Tube secured with: Tape Dental Injury: Teeth and Oropharynx as per pre-operative assessment

## 2016-02-24 NOTE — Brief Op Note (Signed)
02/24/2016  11:56 AM  PATIENT:  [name]  65 y.o. female  PRE-OPERATIVE DIAGNOSIS:  LAPBAND DYSFUNCTION  POST-OPERATIVE DIAGNOSIS:  lapband dysfuction  PROCEDURE:  Procedure(s): LAPAROSCOPIC GASTRIC BAND REMOVAL WITH LAPAROSCOPIC GASTRIC SLEEVE RESECTION (N/A) LAPAROSCOPIC LYSIS OF ADHESIONS HERNIA REPAIR UMBILICAL ADULT  SURGEON:  Surgeon(s) and Role:    * Luretha MurphyMatthew Barett Whidbee, MD - Primary    * Gaynelle AduEric Wilson, MD - Assisting  PHYSICIAN ASSISTANT:   ASSISTANTS: Gaynelle AduEric Wilson, MD, FACS   ANESTHESIA:   general  EBL:  Total I/O In: 1850 [I.V.:1800; IV Piggyback:50] Out: -   BLOOD ADMINISTERED:none  DRAINS: none   LOCAL MEDICATIONS USED:  BUPIVICAINE   SPECIMEN:  Source of Specimen:  stomach  DISPOSITION OF SPECIMEN:  PATHOLOGY  COUNTS:  YES  TOURNIQUET:  * No tourniquets in log *  DICTATION: .Other Dictation: Dictation Number [id number]  PLAN OF CARE: Admit for overnight observation  PATIENT DISPOSITION:  PACU - hemodynamically stable.   Delay start of Pharmacological VTE agent (>24hrs) due to surgical blood loss or risk of bleeding: no

## 2016-02-24 NOTE — H&P (View-Only) (Signed)
Chief Complaint:  lapband complications for removal and possible sleeve gastrectomy  History of Present Illness:  [name] is an 65 y.o. female who had a lapband in [place] and despite being very compliant in CN and while followed here she has not had optimal weight loss and is frustrated by GER and port pain.  She is here for removal and sleeve gastrectomy.  Past Medical History  Diagnosis Date  . Arthritis   . Hypertension   . Asthma   . PONV (postoperative nausea and vomiting)   . Sleep apnea     HX of sleep apnea not currently using c pap  . Claustrophobia     Past Surgical History  Procedure Laterality Date  . Abdominal hysterectomy    . Joint replacement      both knees  . Laparoscopic gastric banding      Current Outpatient Prescriptions  Medication Sig Dispense Refill  . albuterol (PROVENTIL HFA;VENTOLIN HFA) 108 (90 Base) MCG/ACT inhaler Inhale 1 puff into the lungs every 6 (six) hours as needed for wheezing or shortness of breath.    Marland Kitchen amLODipine (NORVASC) 2.5 MG tablet Take 2.5 mg by mouth at bedtime.     . hydrochlorothiazide (HYDRODIURIL) 25 MG tablet Take 25 mg by mouth every evening.     Marland Kitchen lisinopril (PRINIVIL,ZESTRIL) 30 MG tablet Take 30 mg by mouth at bedtime.     . Multiple Vitamin (MULTIVITAMIN WITH MINERALS) TABS tablet Take 1 tablet by mouth daily.    Marland Kitchen zolpidem (AMBIEN CR) 12.5 MG CR tablet Take 12.5 mg by mouth at bedtime.      No current facility-administered medications for this visit.   Morphine and related and Lipitor Family History  Problem Relation Age of Onset  . Stroke Mother   . Heart disease Mother   . Cancer Father     prostate  . Cancer Sister     colon   Social History:   reports that she has never smoked. She has never used smokeless tobacco. She reports that she does not drink alcohol or use illicit drugs.   REVIEW OF SYSTEMS : Negative except for see problem list  Physical Exam:   There were no vitals taken for this  visit. There is no weight on file to calculate BMI.  Gen:  WDWN AAF NAD  Neurological: Alert and oriented to person, place, and time. Motor and sensory function is grossly intact  Head: Normocephalic and atraumatic.  Eyes: Conjunctivae are normal. Pupils are equal, round, and reactive to light. No scleral icterus.  Neck: Normal range of motion. Neck supple. No tracheal deviation or thyromegaly present.  Cardiovascular:  SR without murmurs or gallops.  No carotid bruits Breast:  Not examined Respiratory: Effort normal.  No respiratory distress. No chest wall tenderness. Breath sounds normal.  No wheezes, rales or rhonchi.  Abdomen:  Sore in left upper quadrant over port GU:  Not examined.   Musculoskeletal: Normal range of motion. Extremities are nontender. No cyanosis, edema or clubbing noted Lymphadenopathy: No cervical, preauricular, postauricular or axillary adenopathy is present Skin: Skin is warm and dry. No rash noted. No diaphoresis. No erythema. No pallor. Pscyh: Normal mood and affect. Behavior is normal. Judgment and thought content normal.   LABORATORY RESULTS: Results for orders placed or performed during the hospital encounter of 02/13/16 (from the past 48 hour(s))  CBC WITH DIFFERENTIAL     Status: None   Collection Time: 02/13/16  8:30 AM  Result Value Ref Range  WBC 9.0 4.0 - 10.5 K/uL   RBC 4.48 3.87 - 5.11 MIL/uL   Hemoglobin 13.2 12.0 - 15.0 g/dL   HCT 41.3 36.0 - 46.0 %   MCV 92.2 78.0 - 100.0 fL   MCH 29.5 26.0 - 34.0 pg   MCHC 32.0 30.0 - 36.0 g/dL   RDW 14.1 11.5 - 15.5 %   Platelets 289 150 - 400 K/uL   Neutrophils Relative % 71 %   Neutro Abs 6.4 1.7 - 7.7 K/uL   Lymphocytes Relative 21 %   Lymphs Abs 1.9 0.7 - 4.0 K/uL   Monocytes Relative 7 %   Monocytes Absolute 0.6 0.1 - 1.0 K/uL   Eosinophils Relative 1 %   Eosinophils Absolute 0.1 0.0 - 0.7 K/uL   Basophils Relative 0 %   Basophils Absolute 0.0 0.0 - 0.1 K/uL  Comprehensive metabolic panel      Status: Abnormal   Collection Time: 02/13/16  8:30 AM  Result Value Ref Range   Sodium 144 135 - 145 mmol/L   Potassium 4.4 3.5 - 5.1 mmol/L   Chloride 105 101 - 111 mmol/L   CO2 28 22 - 32 mmol/L   Glucose, Bld 126 (H) 65 - 99 mg/dL   BUN 23 (H) 6 - 20 mg/dL   Creatinine, Ser 0.74 0.44 - 1.00 mg/dL   Calcium 10.1 8.9 - 10.3 mg/dL   Total Protein 7.4 6.5 - 8.1 g/dL   Albumin 4.3 3.5 - 5.0 g/dL   AST 34 15 - 41 U/L   ALT 26 14 - 54 U/L   Alkaline Phosphatase 80 38 - 126 U/L   Total Bilirubin 0.6 0.3 - 1.2 mg/dL   GFR calc non Af Amer >60 >60 mL/min   GFR calc Af Amer >60 >60 mL/min    Comment: (NOTE) The eGFR has been calculated using the CKD EPI equation. This calculation has not been validated in all clinical situations. eGFR's persistently <60 mL/min signify possible Chronic Kidney Disease.    Anion gap 11 5 - 15     RADIOLOGY RESULTS: Ct Head W Wo Contrast  02/12/2016  CLINICAL DATA:  Sharp pain in the right anterior head above the right eye for 4 days. EXAM: CT HEAD WITHOUT AND WITH CONTRAST TECHNIQUE: Contiguous axial images were obtained from the base of the skull through the vertex without and with intravenous contrast CONTRAST:  45m ISOVUE-300 IOPAMIDOL (ISOVUE-300) INJECTION 61% [place] [address] Results: Creatinine 0.7 mg/dL. COMPARISON:  None. FINDINGS: Skull and Sinuses:Negative for fracture or destructive process. The visualized mastoids, middle ears, and imaged paranasal sinuses are clear. Visualized orbits: Negative. Brain: No evidence of acute infarction, hemorrhage, hydrocephalus, or mass lesion/mass effect. Normal cerebral volume and white matter. Intracranial atherosclerosis, most prominent in the vertebral arteries. No abnormal intracranial enhancement. Major intracranial vessels opacify. IMPRESSION: 1. No acute finding or explanation for headache. 2. Intracranial atherosclerosis. Electronically Signed    By: JMonte FantasiaM.D.   On: 02/12/2016 16:13    Problem List: Patient Active Problem List   Diagnosis Date Noted  . Obesity 08/04/2013  . Hyperlipidemia 08/04/2013  . Chest pain 08/04/2013  [place]  . Hypertension 10/17/2012    Assessment & Plan: For removal of lapband and sleeve gastrectomy    Matt B. MHassell Done MD, [facility] [phone] 02/14/2016 1:57 PM

## 2016-02-24 NOTE — Progress Notes (Signed)
Utilization review completed.  

## 2016-02-25 ENCOUNTER — Inpatient Hospital Stay (HOSPITAL_COMMUNITY): Payer: BLUE CROSS/BLUE SHIELD

## 2016-02-25 LAB — CBC WITH DIFFERENTIAL/PLATELET
BASOS PCT: 0 %
Basophils Absolute: 0 10*3/uL (ref 0.0–0.1)
EOS ABS: 0 10*3/uL (ref 0.0–0.7)
Eosinophils Relative: 0 %
HCT: 36.6 % (ref 36.0–46.0)
HEMOGLOBIN: 12 g/dL (ref 12.0–15.0)
LYMPHS ABS: 1.4 10*3/uL (ref 0.7–4.0)
Lymphocytes Relative: 11 %
MCH: 30.2 pg (ref 26.0–34.0)
MCHC: 32.8 g/dL (ref 30.0–36.0)
MCV: 92.2 fL (ref 78.0–100.0)
Monocytes Absolute: 1.1 10*3/uL — ABNORMAL HIGH (ref 0.1–1.0)
Monocytes Relative: 9 %
NEUTROS PCT: 80 %
Neutro Abs: 9.9 10*3/uL — ABNORMAL HIGH (ref 1.7–7.7)
Platelets: 285 10*3/uL (ref 150–400)
RBC: 3.97 MIL/uL (ref 3.87–5.11)
RDW: 14.4 % (ref 11.5–15.5)
WBC: 12.5 10*3/uL — AB (ref 4.0–10.5)

## 2016-02-25 LAB — HEMOGLOBIN AND HEMATOCRIT, BLOOD
HEMATOCRIT: 36.6 % (ref 36.0–46.0)
HEMOGLOBIN: 12.4 g/dL (ref 12.0–15.0)

## 2016-02-25 MED ORDER — IOHEXOL 300 MG/ML  SOLN
50.0000 mL | Freq: Once | INTRAMUSCULAR | Status: AC | PRN
  Administered 2016-02-25: 20 mL via ORAL

## 2016-02-25 NOTE — Progress Notes (Signed)
Patient alert and oriented, Post op day 1.  Provided support and encouragement.  Encouraged pulmonary toilet, ambulation and small sips of liquids.  All questions answered.  Will continue to monitor. 

## 2016-02-26 LAB — CBC WITH DIFFERENTIAL/PLATELET
BASOS ABS: 0 10*3/uL (ref 0.0–0.1)
BASOS PCT: 0 %
Eosinophils Absolute: 0 10*3/uL (ref 0.0–0.7)
Eosinophils Relative: 0 %
HEMATOCRIT: 34.6 % — AB (ref 36.0–46.0)
HEMOGLOBIN: 11.3 g/dL — AB (ref 12.0–15.0)
Lymphocytes Relative: 20 %
Lymphs Abs: 2 10*3/uL (ref 0.7–4.0)
MCH: 30.1 pg (ref 26.0–34.0)
MCHC: 32.7 g/dL (ref 30.0–36.0)
MCV: 92 fL (ref 78.0–100.0)
Monocytes Absolute: 0.9 10*3/uL (ref 0.1–1.0)
Monocytes Relative: 9 %
NEUTROS ABS: 7.2 10*3/uL (ref 1.7–7.7)
NEUTROS PCT: 71 %
Platelets: 275 10*3/uL (ref 150–400)
RBC: 3.76 MIL/uL — ABNORMAL LOW (ref 3.87–5.11)
RDW: 14.4 % (ref 11.5–15.5)
WBC: 10.2 10*3/uL (ref 4.0–10.5)

## 2016-02-26 NOTE — Discharge Summary (Signed)
Physician Discharge Summary  Patient ID: [name] MRN: [id number] DOB/AGE: February 27, 1951 65 y.o.  Admit date: 02/24/2016 Discharge date: 02/26/2016  Admission Diagnoses:  Dysfunctional lapband  Discharge Diagnoses:  Same with small bowel adhesions  Active Problems:   S/P laparoscopic sleeve gastrectomy   Surgery:  Lap enterolysis and removal of lapband and sleeve gastrectomy;  Closure of small umbilical hernia   Discharged Condition: improved  Hospital Course:   Had surgery.  Overnight observation and UGI on PD 1.  Did well and ready for discharge on PD 2.  Kept because of nausea on PD 1  Consults: none  Significant Diagnostic Studies: UGI    Discharge Exam: Blood pressure 142/62, pulse 58, temperature 98.7 F (37.1 C), temperature source Oral, resp. rate 14, height  (1.702 m), weight 125.363 kg (276 lb 6 oz), SpO2 100 %. Incisions OK with LIquiband  Disposition: 01-Home or Self Care  Discharge Instructions    Ambulate hourly while awake    Complete by:  As directed      Call MD for:  difficulty breathing, headache or visual disturbances    Complete by:  As directed      Call MD for:  persistant dizziness or light-headedness    Complete by:  As directed      Call MD for:  persistant nausea and vomiting    Complete by:  As directed      Call MD for:  redness, tenderness, or signs of infection (pain, swelling, redness, odor or green/yellow discharge around incision site)    Complete by:  As directed      Call MD for:  severe uncontrolled pain    Complete by:  As directed      Call MD for:  temperature >101 F    Complete by:  As directed      Diet bariatric full liquid    Complete by:  As directed      Incentive spirometry    Complete by:  As directed   Perform hourly while awake            Medication List    TAKE these medications        acetaminophen 500 MG tablet  Commonly known as:  TYLENOL  Take 1,000 mg by mouth every 6 (six) hours as needed for  mild pain, moderate pain or headache.     albuterol 108 (90 Base) MCG/ACT inhaler  Commonly known as:  PROVENTIL HFA;VENTOLIN HFA  Inhale 1 puff into the lungs every 6 (six) hours as needed for wheezing or shortness of breath.     amLODipine 2.5 MG tablet  Commonly known as:  NORVASC  Take 2.5 mg by mouth at bedtime.  Notes to Patient:  Monitor Blood Pressure Daily and keep a log for primary care physician.  You may need to make changes to your medications with rapid weight loss.        aspirin-acetaminophen-caffeine 250-250-65 MG tablet  Commonly known as:  EXCEDRIN MIGRAINE  Take 2 tablets by mouth every 6 (six) hours as needed for headache or migraine.  Notes to Patient:  Avoid NSAIDs for 6-8 weeks after surgery      DUREZOL 0.05 % Emul  Generic drug:  Difluprednate  Apply 1 drop to eye.     hydrochlorothiazide 25 MG tablet  Commonly known as:  HYDRODIURIL  Take 25 mg by mouth every evening.  Notes to Patient:  Monitor Blood Pressure Daily and keep a log for primary care  physician.  Monitor for symptoms of dehydration.  You may need to make changes to your medications with rapid weight loss.        lisinopril 30 MG tablet  Commonly known as:  PRINIVIL,ZESTRIL  Take 30 mg by mouth at bedtime.  Notes to Patient:  Monitor Blood Pressure Daily and keep a log for primary care physician.  You may need to make changes to your medications with rapid weight loss.        multivitamin with minerals Tabs tablet  Take 1 tablet by mouth daily.     ondansetron 4 MG tablet  Commonly known as:  ZOFRAN  Take 1 tablet (4 mg total) by mouth every 6 (six) hours.     oxyCODONE-acetaminophen 5-325 MG tablet  Commonly known as:  PERCOCET/ROXICET  Take 1-2 tablets by mouth every 4 (four) hours as needed for severe pain.  Notes to Patient:  Do not take with liquid pain medication     traMADol 50 MG tablet  Commonly known as:  ULTRAM  Take 50 mg by mouth every 6 (six) hours as needed for  moderate pain or severe pain.  Notes to Patient:  Patient not taking     zolpidem 12.5 MG CR tablet  Commonly known as:  AMBIEN CR  Take 12.5 mg by mouth at bedtime.  Notes to Patient:  This medication cannot be crushed or cut in half, if too large to swallow may need to discuss with PCP and change to an immediate release formula           Follow-up Information    Follow up with LIEPINS, ANDY, PA-C. Go on 03/19/2016.   Specialty:  Surgery   Why:  For Post-Op Check at 9:00 AM   Contact information:   [address] [place] [address] [phone]       Follow up with Valarie MerinoMARTIN,Gabriela Giannelli B, MD. Go on 04/01/2016.   Specialty:  General Surgery   Why:  For Post-Op Check at 9:15AM   Contact information:   [address] [place] [address] [phone]       Signed: Valarie MerinoMARTIN,Stanislaw Acton B 02/26/2016, 10:58 AM

## 2016-02-26 NOTE — Progress Notes (Signed)
Patient alert and oriented, pain is controlled. Patient is tolerating fluids, advanced to protein shake today, patient tolerating well.  Patient has consumed 6 oz since 6AM.  Reviewed Gastric sleeve discharge instructions with patient and patient is able to articulate understanding.  Provided information on BELT program, Support Group and [facility] outpatient pharmacy. All questions answered, will continue to monitor.

## 2016-02-26 NOTE — Discharge Instructions (Addendum)

## 2016-02-26 NOTE — Plan of Care (Signed)
Problem: Food- and Nutrition-Related Knowledge Deficit (NB-1.1) Goal: Nutrition education Formal process to instruct or train a patient/client in a skill or to impart knowledge to help patients/clients voluntarily manage or modify food choices and eating behavior to maintain or improve health. Outcome: Completed/Met Date Met:  02/26/16 Nutrition Education Note  Received consult for diet education per DROP protocol. Bariatric Nurse Coordinator went over diet with patient prior to visit. RD reviewed diet and answered all questions.  Discussed 2 week post op diet with pt. Emphasized that liquids must be non carbonated, non caffeinated, and sugar free. Fluid goals discussed. Reviewed progression of diet to include soft proteins after 7-10 days. Pt to follow up with outpatient bariatric RD for further diet progression after 2 weeks. Multivitamins and minerals also reviewed. Teach back method used, pt expressed understanding, expect good compliance.   Diet: First 2 Weeks  You will see the dietitian about two (2) weeks after your surgery. The dietitian will increase the types of foods you can eat if you are handling liquids well:  If you have severe vomiting or nausea and cannot handle clear liquids lasting longer than 1 day, call your surgeon  Protein Shake  Drink at least 2 ounces of shake 5-6 times per day  Each serving of protein shakes (usually 8 - 12 ounces) should have a minimum of:  15 grams of protein  And no more than 5 grams of carbohydrate  Goal for protein each day:  Men = 80 grams per day  Women = 60 grams per day  Protein powder may be added to fluids such as non-fat milk or Lactaid milk or Soy milk (limit to 35 grams added protein powder per serving)   Hydration  Slowly increase the amount of water and other clear liquids as tolerated (See Acceptable Fluids)  Slowly increase the amount of protein shake as tolerated  Sip fluids slowly and throughout the day  May use sugar  substitutes in small amounts (no more than 6 - 8 packets per day; i.e. Splenda)   Fluid Goal  The first goal is to drink at least 8 ounces of protein shake/drink per day (or as directed by the nutritionist); some examples of protein shakes are [employer], Dillard's, EAS Edge HP, and Unjury. See handout from pre-op Bariatric Education Class:  Slowly increase the amount of protein shake you drink as tolerated  You may find it easier to slowly sip shakes throughout the day  It is important to get your proteins in first  Your fluid goal is to drink 64 - 100 ounces of fluid daily  It may take a few weeks to build up to this  32 oz (or more) should be clear liquids  And  32 oz (or more) should be full liquids (see below for examples)  Liquids should not contain sugar, caffeine, or carbonation   Clear Liquids:  Water or Sugar-free flavored water (i.e. Fruit H2O, Propel)  Decaffeinated coffee or tea (sugar-free)  Crystal Lite, Wyler's Lite, Minute Maid Lite  Sugar-free Jell-O  Bouillon or broth  Sugar-free Popsicle: *Less than 20 calories each; Limit 1 per day   Full Liquids:  Protein Shakes/Drinks + 2 choices per day of other full liquids  Full liquids must be:  No More Than 12 grams of Carbs per serving  No More Than 3 grams of Fat per serving  Strained low-fat cream soup  Non-Fat milk  Fat-free Lactaid Milk  Sugar-free yogurt (Dannon Lite & Fit, Greek yogurt)  Clayton Bibles, MS, RD, LDN Pager: [phone] After Hours Pager: [phone]

## 2016-03-05 ENCOUNTER — Telehealth (HOSPITAL_COMMUNITY): Payer: Self-pay

## 2016-03-05 NOTE — Telephone Encounter (Signed)

## 2016-03-10 ENCOUNTER — Encounter: Payer: BLUE CROSS/BLUE SHIELD | Attending: Surgery

## 2016-03-10 DIAGNOSIS — Z6841 Body Mass Index (BMI) 40.0 and over, adult: Secondary | ICD-10-CM | POA: Insufficient documentation

## 2016-03-10 DIAGNOSIS — Z9884 Bariatric surgery status: Secondary | ICD-10-CM | POA: Diagnosis present

## 2016-03-11 NOTE — Progress Notes (Signed)
Bariatric Class:  Appt start time: 1530 end time:  1630.  2 Week Post-Operative Nutrition Class  Patient was seen on 03/10/16 for Post-[facility]ative Nutrition [facility].   Surgery date: 02/24/2016 Surgery type: sleeve gastrectomy Start weight at [facility]: 287 lbs on 01/29/16 Weight today: 271.5 lbs  Weight change: 18.5 lbs  TANITA  BODY COMP RESULTS  02/03/16 03/10/16   BMI (kg/m^2) 44.7 41.9   Fat Mass (lbs) 162 146.0   Fat Free Mass (lbs) 128 125.5   Total Body Water (lbs) 93.5 92.0    The following the learning objectives were met by the patient during this course:  Identifies Phase 3A (Soft, High Proteins) Dietary Goals and will begin from 2 weeks post-operatively to 2 months post-operatively  Identifies appropriate sources of fluids and proteins   States protein recommendations and appropriate sources post-operatively  Identifies the need for appropriate texture modifications, mastication, and bite sizes when consuming solids  Identifies appropriate multivitamin and calcium sources post-operatively  Describes the need for physical activity post-operatively and will follow MD recommendations  States when to call healthcare provider regarding medication questions or post-operative complications  Handouts given during class include:  Phase 3A: Soft, High Protein Diet Handout  Follow-Up Plan: Patient will follow-up at [facility] in 6 weeks for 2 month post-op nutrition visit for diet advancement per MD.

## 2016-04-23 ENCOUNTER — Encounter: Payer: BLUE CROSS/BLUE SHIELD | Attending: Surgery | Admitting: Dietician

## 2016-04-23 ENCOUNTER — Encounter: Payer: Self-pay | Admitting: Dietician

## 2016-04-23 DIAGNOSIS — Z9884 Bariatric surgery status: Secondary | ICD-10-CM | POA: Insufficient documentation

## 2016-04-23 DIAGNOSIS — Z6841 Body Mass Index (BMI) 40.0 and over, adult: Secondary | ICD-10-CM | POA: Diagnosis not present

## 2016-04-23 NOTE — Patient Instructions (Addendum)
Goals:  Follow Phase 3B: High Protein + Non-Starchy Vegetables  Eat 3-6 small meals/snacks, every 3-5 hrs  Increase lean protein foods to meet 60g goal  Increase fluid intake to 64oz +  Avoid drinking 15 minutes before, during and 30 minutes after eating  Aim for >30 min of physical activity daily  Try Bariatric Advantage calcium chews  Try Alton RevereWellesse liquid multivitamin [facility]ore([place] [facility])  Surgery date: 02/24/2016 Surgery type: sleeve gastrectomy [facility]: [facility] lbs on 01/29/16 Weight today: 251.5 lbs Weight change: 20 lbs Total weight loss: 35.5 lbs  TANITA  BODY COMP RESULTS  02/03/16 03/10/16 04/23/16   BMI (kg/m^2) 44.7 41.9 38.8   Fat Mass (lbs) 162 146.0 128.5   Fat Free Mass (lbs) 128 125.5 123   Total Body Water (lbs) 93.5 92.0 90

## 2016-04-23 NOTE — Progress Notes (Signed)
  Follow-up visit:  8 Weeks Post-Operative Sleeve gastrectomy Surgery  Medical Nutrition Therapy:  Appt start time: 320 end time:  400.  Primary concerns today: Post-operative Bariatric Surgery Nutrition Management.  [name] returns having lost 20 pounds. She reports having some joint pain and also having itching all over her body. Lotion helps but she still feels like she has a lot of itching (does not think that itching is caused by dry skin). Eating mostly seafood. Eating several times a day and meeting protein needs.  Samples provided and patient instructed on proper use:  Bariatric Advantage Calcium citrate chew (qty 2 - tropical orange): Lot#: [id number] Exp: 07/2016  Bariatric Advantage Calcium citrate chew (qty 2 - strawberry): Lot#: [id number] Exp: 11/2016  Bariatric Advantage Calcium citrate chew (qty 2 - coconut): Lot#: [id number] Exp: 06/2016  Bariatric Advantage Calcium citrate chew (qty 2 - chocolate): Lot#: [id number] Exp: 10/2016  Bariatric Advantage Calcium citrate chew (qty 2 - caramel): Lot#: [id number] Exp: 10/2016  Bariatric Advantage Calcium citrate chew (qty 2 - peanut butter chocolate): Lot#: [id number] Exp: 10/2016   Surgery date: 02/24/2016 Surgery type: sleeve gastrectomy [facility]: [facility] lbs on 01/29/16 Weight today: 251.5 lbs Weight change: 20 lbs Total weight loss: 35.5 lbs  TANITA  BODY COMP RESULTS  02/03/16 03/10/16 04/23/16   BMI (kg/m^2) 44.7 41.9 38.8   Fat Mass (lbs) 162 146.0 128.5   Fat Free Mass (lbs) 128 125.5 123   Total Body Water (lbs) 93.5 92.0 90    Preferred Learning Style:   No preference indicated   Learning Readiness:   Ready  24-hr recall: B (AM): Premier protein shake, decaf tea (30g) Snk (AM):   L (PM): Malaw[place]urkey chili (7-14g) Snk (PM): 1 full deviled egg (7g)  D (PM): 4 shrimp or 1.5-2oz fish, sometimes with green beans (10-14g) Snk (PM): sometimes another 1/2 protein shake, sugar free popsicle   Fluid intake: 32 oz  water with sugar free flavoring, 16 oz decaf tea, 11-17 oz protein shake Estimated total protein intake: 60+ grams per day  Medications: see list Supplementation: taking  Using straws: no Drinking while eating: no Hair loss: none Carbonated beverages: no N/V/D/C: constipation, Milk of Magnesia per Dr. Daphine DeutscherMartin (notices that it resolves with green beans) Dumping syndrome: none  Recent physical activity:  Walking during lunch at work for 30-60 minutes  Progress Towards Goal(s):  In progress.  Handouts given during visit include:  Phase 3B lean protein + non starchy vegetables   Nutritional Diagnosis:  [place]-3.3 Overweight/obesity related to past poor dietary habits and physical inactivity as evidenced by patient w/ recent sleeve gastrectomy surgery following dietary guidelines for continued weight loss.     Intervention:  Nutrition counseling provided.  Teaching Method Utilized:  Visual Auditory Hands on  Barriers to learning/adherence to lifestyle change: none  Demonstrated degree of understanding via:  Teach Back   Monitoring/Evaluation:  Dietary intake, exercise, and body weight. Follow up in 2 months for 4 month post-op visit.

## 2016-07-01 ENCOUNTER — Ambulatory Visit: Payer: BLUE CROSS/BLUE SHIELD | Admitting: Dietician

## 2016-07-08 ENCOUNTER — Ambulatory Visit: Payer: BLUE CROSS/BLUE SHIELD | Admitting: Dietician

## 2016-07-13 ENCOUNTER — Other Ambulatory Visit: Payer: Self-pay | Admitting: Family Medicine

## 2016-07-13 DIAGNOSIS — Z1231 Encounter for screening mammogram for malignant neoplasm of breast: Secondary | ICD-10-CM

## 2016-07-16 ENCOUNTER — Encounter: Payer: BLUE CROSS/BLUE SHIELD | Attending: Surgery | Admitting: Dietician

## 2016-07-16 ENCOUNTER — Encounter: Payer: Self-pay | Admitting: Dietician

## 2016-07-16 DIAGNOSIS — Z9884 Bariatric surgery status: Secondary | ICD-10-CM | POA: Insufficient documentation

## 2016-07-16 DIAGNOSIS — Z6841 Body Mass Index (BMI) 40.0 and over, adult: Secondary | ICD-10-CM | POA: Diagnosis not present

## 2016-07-16 DIAGNOSIS — E669 Obesity, unspecified: Secondary | ICD-10-CM

## 2016-07-16 NOTE — Progress Notes (Signed)
  Follow-up visit:  4.5 months Post-Operative Sleeve gastrectomy Surgery  Medical Nutrition Therapy:  Appt start time: 800 end time:  845  Primary concerns today: Post-operative Bariatric Surgery Nutrition Management.  [name] returns having lost another 30 pounds. She reports her itching has resolved with different supplements. Craving salty, crunchy foods. Otherwise she is feeling fairly satisfied with her diet.   Surgery date: 02/24/2016 Surgery type: sleeve gastrectomy Start weight at [facility]: 287 lbs on 01/29/16 Weight today: 222.2 lbs Weight change: 30 lbs Total weight loss: 68 lbs  TANITA  BODY COMP RESULTS  02/03/16 03/10/16 04/23/16 07/16/16   BMI (kg/m^2) 44.7 41.9 38.8 34.3   Fat Mass (lbs) 162 146.0 128.5 104.6   Fat Free Mass (lbs) 128 125.5 123 117.6   Total Body Water (lbs) 93.5 92.0 90 84.8    Preferred Learning Style:   No preference indicated   Learning Readiness:   Ready  24-hr recall: B (AM): Premier protein shake, decaf tea (30g) Snk (AM):  Boiled egg or piece of cheese (7g) L (PM): 3 oz chicken, sometimes with salad (21g) Snk (PM): string cheese (7g) Snk (PM): sometimes a Premier protein clear (20g) D (PM): 3 oz meat, sometimes with salad (21g) Snk (PM): sometimes another 1/2 protein shake, sugar free popsicle (15g)  Fluid intake: 32 oz water with sugar free flavoring, 16 oz decaf tea, 11-17 oz protein shake (patient reports 64 oz most days) Estimated total protein intake: 100+ grams per day  Medications: see list Supplementation: taking  Using straws: no Drinking while eating: no Hair loss: none Carbonated beverages: no N/V/D/C: constipation, taking Milk of Magnesia per Dr. Daphine Deutscher Dumping syndrome: none  Recent physical activity:  Walking at least 30 minutes 5-6 days a week  Progress Towards Goal(s):  In progress.  Handouts given during visit include:  none   Nutritional Diagnosis:  [place]-3.3 Overweight/obesity related to past poor dietary habits  and physical inactivity as evidenced by patient w/ recent sleeve gastrectomy surgery following dietary guidelines for continued weight loss.     Intervention:  Nutrition counseling provided.  Teaching Method Utilized:  Visual Auditory Hands on  Barriers to learning/adherence to lifestyle change: none  Demonstrated degree of understanding via:  Teach Back   Monitoring/Evaluation:  Dietary intake, exercise, and body weight. Follow up in 6 weeks for 6 month post-op visit.

## 2016-07-16 NOTE — Patient Instructions (Addendum)
Goals:   Try Quest protein chips (GNC or Vitamin Shoppe or maybe WalMart), pork rinds, or Parmesan Whisps (Sams or [employer]) for a crunch  Nonscale victories: new cute clothes, confidence, feeling better, more energy and endurance, eating for nourishment and taking care of yourself, less medication  Try vanilla or caramel Premier protein shake for coffee creamer   Surgery date: 02/24/2016 Surgery type: sleeve gastrectomy Start weight at [facility]: 287 lbs on 01/29/16 Weight today: 222.2 lbs Weight change: 30 lbs Total weight loss: 68 lbs  TANITA  BODY COMP RESULTS  02/03/16 03/10/16 04/23/16 07/16/16   BMI (kg/m^2) 44.7 41.9 38.8 34.3   Fat Mass (lbs) 162 146.0 128.5 104.6   Fat Free Mass (lbs) 128 125.5 123 117.6   Total Body Water (lbs) 93.5 92.0 90 84.8

## 2016-08-06 ENCOUNTER — Ambulatory Visit: Payer: BLUE CROSS/BLUE SHIELD

## 2016-08-07 ENCOUNTER — Ambulatory Visit: Payer: BLUE CROSS/BLUE SHIELD

## 2016-08-10 ENCOUNTER — Ambulatory Visit
Admission: RE | Admit: 2016-08-10 | Discharge: 2016-08-10 | Disposition: A | Discharge status: Home / Self Care | Payer: BLUE CROSS/BLUE SHIELD | Source: Ambulatory Visit | Attending: Family Medicine | Admitting: Family Medicine

## 2016-08-10 DIAGNOSIS — Z1231 Encounter for screening mammogram for malignant neoplasm of breast: Secondary | ICD-10-CM

## 2016-08-26 ENCOUNTER — Ambulatory Visit: Payer: BLUE CROSS/BLUE SHIELD | Admitting: Dietician

## 2016-09-09 ENCOUNTER — Ambulatory Visit: Payer: BLUE CROSS/BLUE SHIELD | Admitting: Dietician

## 2016-09-30 ENCOUNTER — Encounter: Payer: BLUE CROSS/BLUE SHIELD | Attending: Surgery | Admitting: Dietician

## 2016-09-30 DIAGNOSIS — Z6841 Body Mass Index (BMI) 40.0 and over, adult: Secondary | ICD-10-CM | POA: Diagnosis not present

## 2016-09-30 DIAGNOSIS — E669 Obesity, unspecified: Secondary | ICD-10-CM

## 2016-09-30 DIAGNOSIS — Z9884 Bariatric surgery status: Secondary | ICD-10-CM | POA: Insufficient documentation

## 2016-09-30 NOTE — Progress Notes (Signed)
  Follow-up visit:  7 months Post-Operative Sleeve gastrectomy Surgery  Medical Nutrition Therapy:  Appt start time: 805 end time:  845  Primary concerns today: Post-operative Bariatric Surgery Nutrition Management.  [name] returns having lost another 11 pounds. Feeling better physically and emotionally. Feeling a cramp in her left upper quadrant sometimes. Has an appointment with Dr. Daphine DeutscherMartin this week and plans to discuss this with him. She states she does not see herself for the size she really is. No longer taking blood pressure medication. Still craving crunchy, salty foods. Went to support group and found that it was a negative experience for her.  Surgery date: 02/24/2016 Surgery type: sleeve gastrectomy [facility]: 287 lbs on 01/29/16 Weight today: 211.2 lbs Weight change: 11 lbs Total weight loss: 76 lbs  TANITA  BODY COMP RESULTS  02/03/16 03/10/16 04/23/16 07/16/16 09/30/16   BMI (kg/m^2) 44.7 41.9 38.8 34.3 32.6   Fat Mass (lbs) 162 146.0 128.5 104.6 97.2   Fat Free Mass (lbs) 128 125.5 123 117.6 114   Total Body Water (lbs) 93.5 92.0 90 84.8 81.8    Preferred Learning Style:   No preference indicated   Learning Readiness:   Ready  24-hr recall: B (AM): Premier protein shake, decaf tea (30g) Snk (AM):  Boiled egg or piece of cheese (7g) L (PM): 3 oz chicken, sometimes with salad (21g) Snk (PM): string cheese (7g) Snk (PM): sometimes a Premier protein clear (20g) D (PM): 3 oz meat, sometimes with salad (21g) Snk (PM): sometimes another 1/2 protein shake, sugar free popsicle (15g)  Fluid intake: 32 oz water with sugar free flavoring, 16 oz decaf tea, 11-17 oz protein shake (patient reports 64 oz most days) Estimated total protein intake: 100+ grams per day  Medications: see list Supplementation: taking  Using straws: no Drinking while eating: no Hair loss: none Carbonated beverages: no N/V/D/C: constipation, taking Milk of Magnesia per Dr. Daphine DeutscherMartin Dumping  syndrome: none  Recent physical activity:  Walking at least 30 minutes 5-6 days a week  Progress Towards Goal(s):  In progress.  Handouts given during visit include:  none   Nutritional Diagnosis:  [place]-3.3 Overweight/obesity related to past poor dietary habits and physical inactivity as evidenced by patient w/ recent sleeve gastrectomy surgery following dietary guidelines for continued weight loss.     Intervention:  Nutrition counseling provided.  Teaching Method Utilized:  Visual Auditory Hands on  Barriers to learning/adherence to lifestyle change: none  Demonstrated degree of understanding via:  Teach Back   Monitoring/Evaluation:  Dietary intake, exercise, and body weight. Follow up in 8 weeks for 9 month post-op visit.

## 2016-09-30 NOTE — Patient Instructions (Addendum)
Goals:   Try Quest protein chips (GNC or Vitamin Shoppe or maybe WalMart), pork rinds, or Parmesan Whisps (Sams or [employer]Mart) for a crunch  Preportion nuts!!  Nonscale victories: new cute clothes, confidence, feeling better, more energy and endurance, eating for nourishment and taking care of yourself, less medication  Try vanilla or caramel Premier protein shake for coffee creamer  Keep the good habits that you have developed!   Surgery date: 02/24/2016 Surgery type: sleeve gastrectomy [facility]: 287 lbs on 01/29/16 Weight today: 211.2 lbs Weight change: 11 lbs Total weight loss: 76 lbs  TANITA  BODY COMP RESULTS  02/03/16 03/10/16 04/23/16 07/16/16 09/30/16   BMI (kg/m^2) 44.7 41.9 38.8 34.3 32.6   Fat Mass (lbs) 162 146.0 128.5 104.6 97.2   Fat Free Mass (lbs) 128 125.5 123 117.6 114   Total Body Water (lbs) 93.5 92.0 90 84.8 81.8

## 2016-12-02 ENCOUNTER — Ambulatory Visit: Payer: BLUE CROSS/BLUE SHIELD | Admitting: Dietician

## 2017-01-01 ENCOUNTER — Ambulatory Visit: Payer: BLUE CROSS/BLUE SHIELD | Admitting: Dietician

## 2017-01-07 ENCOUNTER — Ambulatory Visit: Payer: BLUE CROSS/BLUE SHIELD | Admitting: Dietician

## 2017-01-19 ENCOUNTER — Ambulatory Visit: Payer: BLUE CROSS/BLUE SHIELD | Admitting: Skilled Nursing Facility1

## 2017-01-26 ENCOUNTER — Encounter: Payer: BLUE CROSS/BLUE SHIELD | Attending: Surgery | Admitting: Dietician

## 2017-01-26 ENCOUNTER — Encounter: Payer: Self-pay | Admitting: Dietician

## 2017-01-26 DIAGNOSIS — Z9884 Bariatric surgery status: Secondary | ICD-10-CM | POA: Insufficient documentation

## 2017-01-26 DIAGNOSIS — Z713 Dietary counseling and surveillance: Secondary | ICD-10-CM | POA: Insufficient documentation

## 2017-01-26 DIAGNOSIS — E669 Obesity, unspecified: Secondary | ICD-10-CM | POA: Diagnosis present

## 2017-01-26 DIAGNOSIS — Z4889 Encounter for other specified surgical aftercare: Secondary | ICD-10-CM | POA: Insufficient documentation

## 2017-01-26 NOTE — Progress Notes (Signed)
  Follow-up visit:  11 months Post-Operative Sleeve gastrectomy Surgery  Medical Nutrition Therapy:  Appt start time: 830 end time:  910  Primary concerns today: Post-operative Bariatric Surgery Nutrition Management.  [name] returns having lost another 11 pounds. She reports that she is feeling very well. Craving nuts and makes sure to only buy the small packages. Would like to get under 200 lbs but overall satisfied with her weight loss. She reports she is nervous about maintenance phase. Notices that she can eat a few bites of play foods and this satisfies her. Working to stop eating when she starts getting full. Continues to focus on protein.  Surgery date: 02/24/2016 Surgery type: sleeve gastrectomy [facility] 287 lbs on 01/29/16 (highest weight 301 lbs) Weight today: 200.4 lbs Weight change: 11 lbs Total weight loss: 87 lbs (101 lbs)  TANITA  BODY COMP RESULTS  02/03/16 03/10/16 04/23/16 07/16/16 09/30/16 01/26/17   BMI (kg/m^2) 44.7 41.9 38.8 34.3 32.6 30.9   Fat Mass (lbs) 162 146.0 128.5 104.6 97.2 89.6   Fat Free Mass (lbs) 128 125.5 123 117.6 114 110.8   Total Body Water (lbs) 93.5 92.0 90 84.8 81.8 79    Preferred Learning Style:   No preference indicated   Learning Readiness:   Ready  24-hr recall: B (AM): Premier protein shake, coffee (30g) Snk (AM):  Almonds or Atkins snack bar (5-9g) L (PM): 3 oz chicken, sometimes with salad (21g) Snk (PM): string cheese (7g) Snk (PM): sometimes a Premier protein clear (20g) D (PM): 3 oz meat, sometimes with salad (21g) Snk (PM): sometimes another 1/2 protein shake, sugar free popsicle (15g)  Fluid intake: 32 oz water with sugar free flavoring, 16 oz decaf tea, 11-17 oz protein shake (patient reports 64 oz most days) Estimated total protein intake: 100+ grams per day  Medications: see list Supplementation: taking  Using straws: no Drinking while eating: no Hair loss: none Carbonated beverages: no N/V/D/C:  constipation, taking Milk of Magnesia per Dr. Daphine DeutscherMartin Dumping syndrome: none  Recent physical activity:  Walking, inconsistent  Progress Towards Goal(s):  In progress.  Handouts given during visit include:  none   Nutritional Diagnosis:  [place]-3.3 Overweight/obesity related to past poor dietary habits and physical inactivity as evidenced by patient w/ recent sleeve gastrectomy surgery following dietary guidelines for continued weight loss.     Intervention:  Nutrition counseling provided.  Teaching Method Utilized:  Visual Auditory Hands on  Barriers to learning/adherence to lifestyle change: none  Demonstrated degree of understanding via:  Teach Back   Monitoring/Evaluation:  Dietary intake, exercise, and body weight. Follow up in 4 weeks for 12 month post-op visit.

## 2017-01-26 NOTE — Patient Instructions (Addendum)
Goals:   Try Quest protein chips (GNC or Vitamin Shoppe or maybe WalMart), pork rinds, or Parmesan Whisps (Sams or [employer]) for a crunch  Preportion nuts!!  Nonscale victories: new cute clothes, confidence, feeling better, more energy and endurance, eating for nourishment and taking care of yourself, less medication  Try vanilla or caramel Premier protein shake for coffee creamer  Keep the good habits that you have developed!  Surgery date: 02/24/2016 Surgery type: sleeve gastrectomy [facility]: 287 lbs on 01/29/16 (highest weight 301 lbs) Weight today: 200.4 lbs Weight change: 11 lbs Total weight loss: 87 lbs (101 lbs)  TANITA  BODY COMP RESULTS  02/03/16 03/10/16 04/23/16 07/16/16 09/30/16 01/26/17   BMI (kg/m^2) 44.7 41.9 38.8 34.3 32.6 30.9   Fat Mass (lbs) 162 146.0 128.5 104.6 97.2 89.6   Fat Free Mass (lbs) 128 125.5 123 117.6 114 110.8   Total Body Water (lbs) 93.5 92.0 90 84.8 81.8 79

## 2017-02-24 ENCOUNTER — Ambulatory Visit: Payer: BLUE CROSS/BLUE SHIELD | Admitting: Skilled Nursing Facility1

## 2017-03-04 ENCOUNTER — Encounter: Payer: BLUE CROSS/BLUE SHIELD | Attending: Surgery | Admitting: Skilled Nursing Facility1

## 2017-03-04 ENCOUNTER — Encounter: Payer: Self-pay | Admitting: Skilled Nursing Facility1

## 2017-03-04 DIAGNOSIS — Z713 Dietary counseling and surveillance: Secondary | ICD-10-CM | POA: Insufficient documentation

## 2017-03-04 DIAGNOSIS — Z9884 Bariatric surgery status: Secondary | ICD-10-CM | POA: Insufficient documentation

## 2017-03-04 DIAGNOSIS — Z4889 Encounter for other specified surgical aftercare: Secondary | ICD-10-CM | POA: Diagnosis not present

## 2017-03-04 DIAGNOSIS — E669 Obesity, unspecified: Secondary | ICD-10-CM | POA: Diagnosis present

## 2017-03-04 NOTE — Progress Notes (Signed)
  Medical Nutrition Therapy:  Appt start time: 830 end time:  910  Primary concerns today: Post-operative Bariatric Surgery Nutrition Management.  [name] returns having gained 2 pounds.   Pt is consuming about 1380 calories with no movement. Pt states she wants to join the [employer]. Pt states she does not sleep well at night and work is very busy. Pt states she is not happy at all with her weight gain.   Surgery date: 02/24/2016 Surgery type: sleeve gastrectomy [facility]: 287 lbs on 01/29/16 (highest weight 301 lbs) Weight today: 202.2 lbs Weight change: 2 lbs gain  TANITA  BODY COMP RESULTS  02/03/16 03/10/16 04/23/16 07/16/16 09/30/16 01/26/17 03/04/2017   BMI (kg/m^2) 44.7 41.9 38.8 34.3 32.6 30.9 31.2   Fat Mass (lbs) 162 146.0 128.5 104.6 97.2 89.6 91.4   Fat Free Mass (lbs) 128 125.5 123 117.6 114 110.8 110.8   Total Body Water (lbs) 93.5 92.0 90 84.8 81.8 79 79    Preferred Learning Style:   No preference indicated   Learning Readiness:   Ready  24-hr recall: B (AM): Premier protein shake, coffee (30g) Snk (AM):  Almonds or Atkins snack bar (5-9g) L (PM):stir fry with chicken and shrimp (21g) Snk (PM): nuts (9g) sometimes 2 packs (18 g) Snk (PM): sometimes a Premier protein clear (20g) D (PM): 3 oz meat, sometimes with salad (21g) Snk (PM): atkins bar (5g) and a yogurt (12g) Fluid intake: 32 oz water with sugar free flavoring, 16 oz decaf tea, 11-17 oz protein shake (patient reports 64 oz most days): sometimes not getting in 64 fluid ounces  Estimated total protein intake: 100+ grams per day  Medications: see list Supplementation: centrum multi and calcium and b12  Using straws: yes Drinking while eating: no Hair loss: none Carbonated beverages: no N/V/D/C/GAS:no, no, no,  constipation, taking Milk of Magnesia per Dr. Daphine DeutscherMartin, no Dumping syndrome: none Having you been chewing well: yes Chewing/swallowing difficulties:  no Changes in vision: yes: seeing  the doctor Changes to mood/headaches: headaches due to sinuses Hair loss/Changes to skin/Changes to nails: no Any difficulty focusing or concentrating: no Sweating: no Dizziness/Lightheaded: no Palpitations: no Abdominal Pain: Some  Recent physical activity:  ADL's  Progress Towards Goal(s):  In progress.  Handouts given during visit include:  none   Nutritional Diagnosis:  [place]-3.3 Overweight/obesity related to past poor dietary habits and physical inactivity as evidenced by patient w/ recent sleeve gastrectomy surgery following dietary guidelines for continued weight loss.  Intervention:  Nutrition counseling provided. Goals: -Keep working on getting 64+ Fluid ounces  -Try kefir  -Try to get to the gym 2 weekdays and 1 weekend day: take your time  -Stick to 1 packet of nuts and 1 bar for the day for your snacks -Try the bariatric multi from [place] -Ask [place] if they have a Thiamin supplement   Teaching Method Utilized:  Visual Auditory Hands on  Barriers to learning/adherence to lifestyle change: none  Demonstrated degree of understanding via:  Teach Back   Monitoring/Evaluation:  Dietary intake, exercise, and body weight.

## 2017-03-04 NOTE — Patient Instructions (Addendum)
-  Keep working on getting 64+ Fluid ounces   -Try kefir   -Try to get to the gym 2 weekdays and 1 weekend day: take your time   -Stick to 1 packet of nuts and 1 bar for the day for your snacks  -Try the bariatric multi from [place]  -Ask [place] if they have a Thiamin supplement

## 2017-04-21 ENCOUNTER — Ambulatory Visit: Payer: BLUE CROSS/BLUE SHIELD | Admitting: Skilled Nursing Facility1

## 2017-06-16 ENCOUNTER — Ambulatory Visit: Payer: BLUE CROSS/BLUE SHIELD | Admitting: Registered"

## 2017-07-22 ENCOUNTER — Other Ambulatory Visit: Payer: Self-pay | Admitting: Family Medicine

## 2017-07-22 ENCOUNTER — Ambulatory Visit: Payer: BLUE CROSS/BLUE SHIELD | Admitting: Registered"

## 2017-07-22 DIAGNOSIS — Z1231 Encounter for screening mammogram for malignant neoplasm of breast: Secondary | ICD-10-CM

## 2017-08-04 ENCOUNTER — Encounter: Payer: Self-pay | Admitting: Registered"

## 2017-08-04 ENCOUNTER — Encounter: Payer: BLUE CROSS/BLUE SHIELD | Attending: Surgery | Admitting: Registered"

## 2017-08-04 DIAGNOSIS — E669 Obesity, unspecified: Secondary | ICD-10-CM | POA: Insufficient documentation

## 2017-08-04 DIAGNOSIS — Z713 Dietary counseling and surveillance: Secondary | ICD-10-CM | POA: Insufficient documentation

## 2017-08-04 NOTE — Patient Instructions (Addendum)
-   Snack on non-starchy vegetables guilt-free after meeting 60g protein needs.    - Keep fat and sugar in single digits per serving on nutrition facts label.   - Limit intake of nuts to 1 small bag per day.  - Use Bariatric Advantage multivitamin from [facility].   - Try other acceptable calcium supplements such as TUMS, Viactiv calcium, Citracal, etc.

## 2017-08-04 NOTE — Progress Notes (Signed)
  Medical Nutrition Therapy:  Appt start time: 8:00 end time:  8:35  Primary concerns today: Post-operative Bariatric Surgery Nutrition Management.  Non scale victories: BP decreased by half   Pt arrives having gained 12.2 lbs from previous visit. Pt states she is not happy with weight gain and wants to improve current habits. Pt states she is eating about every 2 hours but unable to walk at work anymore due to it being busy.   Pt is consuming about 1380 calories with no movement. Pt states she wants to join the [employer]. Pt states she does not sleep well at night and work is very busy.    Surgery date: 02/24/2016 Surgery type: sleeve gastrectomy [facility] 287 lbs on 01/29/16 (highest weight 301 lbs) Weight today: 214.4 lbs Weight change: 12.2 lbs gain  TANITA  BODY COMP RESULTS  02/03/16 03/10/16 04/23/16 07/16/16 09/30/16 01/26/17 03/04/2017 08/04/2017   BMI (kg/m^2) 44.7 41.9 38.8 34.3 32.6 30.9 31.2 33.1   Fat Mass (lbs) 162 146.0 128.5 104.6 97.2 89.6 91.4 98.6   Fat Free Mass (lbs) 128 125.5 123 117.6 114 110.8 110.8 115.8   Total Body Water (lbs) 93.5 92.0 90 84.8 81.8 79 79 83.2    Preferred Learning Style:   No preference indicated   Learning Readiness:   Ready  24-hr recall: B (AM): Premier protein shake, coffee (30g) Snk (AM):  Almonds or Atkins snack bar (5-9g) L (PM): salad with deli meat, cheese; stir fry with chicken and shrimp (21g) Snk (PM): pork rinds, nuts (9g) sometimes 2 packs (18 g) Snk (PM): sometimes a Premier protein clear (20g) D (PM): 3 oz meat, sometimes with salad (21g) Snk (PM): atkins bar (5g) and a yogurt (12g)  Fluid intake: 32 oz water with sugar free flavoring, 16 oz decaf tea, 11-17 oz protein shake (patient reports 64 oz most days): sometimes not getting in 64 fluid ounces  Estimated total protein intake: 100+ grams per day  Medications: see list Supplementation: centrum multi 2x/day, calcium and b12  Using straws:  yes Drinking while eating: no Hair loss: none Carbonated beverages: no N/V/D/C/GAS:no, no, no,  constipation, taking Milk of Magnesia per Dr. Daphine DeutscherMartin, no Dumping syndrome: none Having you been chewing well: yes Chewing/swallowing difficulties:  no Changes in vision: yes: seeing the doctor Changes to mood/headaches: headaches due to sinuses Hair loss/Changes to skin/Changes to nails: no Any difficulty focusing or concentrating: no Sweating: no Dizziness/Lightheaded: no Palpitations: no Abdominal Pain: Some  Recent physical activity:  ADL's  Progress Towards Goal(s):  In progress.  Handouts given during visit include:  none   Nutritional Diagnosis:  [place]-3.3 Overweight/obesity related to past poor dietary habits and physical inactivity as evidenced by patient w/ recent sleeve gastrectomy surgery following dietary guidelines for continued weight loss.  Intervention:  Nutrition counseling provided. Goals: - Snack on non-starchy vegetables guilt-free after meeting 60g protein needs.   - Keep fat and sugar in single digits per serving on nutrition facts label.  - Limit intake of nuts to 1 small bag per day. - Use Bariatric Advantage multivitamin from [facility].  - Try other acceptable calcium supplements such as TUMS, Viactiv calcium, Citracal, etc.    Teaching Method Utilized:  Visual Auditory Hands on  Barriers to learning/adherence to lifestyle change: none  Demonstrated degree of understanding via:  Teach Back   Monitoring/Evaluation:  Dietary intake, exercise, and body weight. Follow-up in 2 months for 17 month post-op visit.

## 2017-08-12 ENCOUNTER — Ambulatory Visit
Admission: RE | Admit: 2017-08-12 | Discharge: 2017-08-12 | Disposition: A | Discharge status: Home / Self Care | Payer: BLUE CROSS/BLUE SHIELD | Source: Ambulatory Visit | Attending: Family Medicine | Admitting: Family Medicine

## 2017-08-12 DIAGNOSIS — Z1231 Encounter for screening mammogram for malignant neoplasm of breast: Secondary | ICD-10-CM

## 2017-08-13 ENCOUNTER — Ambulatory Visit: Payer: BLUE CROSS/BLUE SHIELD

## 2017-09-21 ENCOUNTER — Encounter: Payer: Self-pay | Admitting: Skilled Nursing Facility1

## 2017-09-21 ENCOUNTER — Encounter: Payer: BLUE CROSS/BLUE SHIELD | Attending: Surgery | Admitting: Skilled Nursing Facility1

## 2017-09-21 DIAGNOSIS — Z713 Dietary counseling and surveillance: Secondary | ICD-10-CM | POA: Insufficient documentation

## 2017-09-21 DIAGNOSIS — E669 Obesity, unspecified: Secondary | ICD-10-CM | POA: Diagnosis present

## 2017-09-21 NOTE — Patient Instructions (Addendum)
-  Aim for 80-90 ounces of water, flavored water, or clear water  -Pay attention to your body cues: it you get heart burn that is your bodies way of saying it was too much  -Aim to eat every 3 hours starting within 1 -1.5 hours of waking  -Try carrots and hummus  -Try to walk 30 minutes a day

## 2017-09-21 NOTE — Progress Notes (Signed)
Medical Nutrition Therapy:  Appt start time: 8:00 end time:  8:35  Primary concerns today: Post-operative Bariatric Surgery Nutrition Management.  Non scale victories: BP decreased by half  Pt states she is very upset because she is gaining wt. Pt states she knows she is gaining wt because he no longer walks every day and is very stressed with work: her work load has increased. Pt states she gets heart burn with pork rinds, balsamic vinaigrette. Pt states she deals with stress at work with eating. Pt was given emotional eating handouts to help guide her through appropriate meal patterns. Pt states she is going to work on finding a group to join for social interaction and may go to the bariatric support group.  When dietitian did not have the answer to exactly how many pounds she would lose in a month pt was noticeable vexed.   Surgery date: 02/24/2016 Surgery type: sleeve gastrectomy Start weight at [facility]: 287 lbs on 01/29/16 (highest weight 301 lbs) Weight today: 220.2 lbs   TANITA  BODY COMP RESULTS  02/03/16 03/10/16 04/23/16 07/16/16 09/30/16 01/26/17 03/04/2017 08/04/2017 09/20/2017   BMI (kg/m^2) 44.7 41.9 38.8 34.3 32.6 30.9 31.2 33.1 34   Fat Mass (lbs) 162 146.0 128.5 104.6 97.2 89.6 91.4 98.6 102   Fat Free Mass (lbs) 128 125.5 123 117.6 114 110.8 110.8 115.8 118.2   Total Body Water (lbs) 93.5 92.0 90 84.8 81.8 79 79 83.2 85    Preferred Learning Style:   No preference indicated   Learning Readiness:   Ready  24-hr recall: B (AM): Premier protein shake, coffee (30g) Snk (9:30-10AM):  Almonds or Atkins snack bar (5-9g) or pork rinds L (PM): salad with deli meat or cheese; stir fry with chicken and shrimp (21g) Snk (PM): pork rinds, nuts (9g) sometimes 2 packs (18 g) Snk (PM): sometimes a Premier protein clear (20g) D (PM): cheese puffs and nuts and meat and greens Snk (PM): atkins bar (5g) and a yogurt (12g)  Fluid intake: 32 oz water with sugar free flavoring, 16 oz decaf  tea, 11-17 oz protein shake (patient reports 64 oz most days): sometimes not getting in 64 fluid ounces  Estimated total protein intake: 100+ grams per day  Medications: see list Supplementation: centrum multi 2x/day, calcium and b12  Using straws: yes Drinking while eating: no Hair loss: none Carbonated beverages: yes N/V/D/C/GAS:no, no, no,  constipation, taking Milk of Magnesia per Dr. Daphine Deutscher, no Dumping syndrome: none Having you been chewing well: yes Chewing/swallowing difficulties:  no Changes in vision: yes: seeing the doctor Changes to mood/headaches: headaches due to sinuses Hair loss/Changes to skin/Changes to nails: no Any difficulty focusing or concentrating: no Sweating: no Dizziness/Lightheaded: no Palpitations: no Abdominal Pain: no  Recent physical activity:  ADL's  Progress Towards Goal(s):  In progress.  Handouts given during visit include:  none   Nutritional Diagnosis:  [place]-3.3 Overweight/obesity related to past poor dietary habits and physical inactivity as evidenced by patient w/ recent sleeve gastrectomy surgery following dietary guidelines for continued weight loss.  Intervention:  Nutrition counseling provided. Goals:  -Aim for 80-90 ounces of water, flavored water, or clear water -Pay attention to your body cues: it you get heart burn that is your bodies way of saying it was too much -Aim to eat every 3 hours starting within 1 -1.5 hours of waking -Try carrots and hummus -Try to walk 30 minutes a day  Teaching Method Utilized:  Visual Auditory Hands on  Barriers to learning/adherence  to lifestyle change: none  Demonstrated degree of understanding via:  Teach Back   Monitoring/Evaluation:  Dietary intake, exercise, and body weight.

## 2017-10-06 ENCOUNTER — Ambulatory Visit: Payer: BLUE CROSS/BLUE SHIELD | Admitting: Registered"

## 2017-10-26 ENCOUNTER — Ambulatory Visit: Payer: BLUE CROSS/BLUE SHIELD | Admitting: Skilled Nursing Facility1

## 2018-05-11 ENCOUNTER — Other Ambulatory Visit: Payer: Self-pay | Admitting: Family Medicine

## 2018-05-11 DIAGNOSIS — R519 Headache, unspecified: Secondary | ICD-10-CM

## 2018-05-11 DIAGNOSIS — R51 Headache: Principal | ICD-10-CM

## 2018-05-12 ENCOUNTER — Other Ambulatory Visit: Payer: Self-pay | Admitting: Family Medicine

## 2018-05-12 ENCOUNTER — Ambulatory Visit
Admission: RE | Admit: 2018-05-12 | Discharge: 2018-05-12 | Disposition: A | Discharge status: Home / Self Care | Payer: BLUE CROSS/BLUE SHIELD | Source: Ambulatory Visit | Attending: Family Medicine | Admitting: Family Medicine

## 2018-05-12 DIAGNOSIS — R519 Headache, unspecified: Secondary | ICD-10-CM

## 2018-05-12 DIAGNOSIS — R51 Headache: Principal | ICD-10-CM

## 2018-08-01 ENCOUNTER — Other Ambulatory Visit: Payer: Self-pay | Admitting: Family Medicine

## 2018-08-01 ENCOUNTER — Other Ambulatory Visit: Payer: Self-pay | Admitting: Surgery

## 2018-08-01 DIAGNOSIS — R1012 Left upper quadrant pain: Secondary | ICD-10-CM

## 2018-08-01 DIAGNOSIS — Z1231 Encounter for screening mammogram for malignant neoplasm of breast: Secondary | ICD-10-CM

## 2018-08-03 ENCOUNTER — Ambulatory Visit
Admission: RE | Admit: 2018-08-03 | Discharge: 2018-08-03 | Disposition: A | Discharge status: Home / Self Care | Payer: BLUE CROSS/BLUE SHIELD | Source: Ambulatory Visit | Attending: Surgery | Admitting: Surgery

## 2018-08-03 DIAGNOSIS — R1012 Left upper quadrant pain: Secondary | ICD-10-CM

## 2018-08-03 MED ORDER — IOPAMIDOL (ISOVUE-300) INJECTION 61%
125.0000 mL | Freq: Once | INTRAVENOUS | Status: AC | PRN
  Administered 2018-08-03: 125 mL via INTRAVENOUS

## 2018-09-02 ENCOUNTER — Ambulatory Visit: Payer: BLUE CROSS/BLUE SHIELD

## 2018-09-29 ENCOUNTER — Ambulatory Visit: Payer: BLUE CROSS/BLUE SHIELD

## 2018-10-11 ENCOUNTER — Ambulatory Visit
Admission: RE | Admit: 2018-10-11 | Discharge: 2018-10-11 | Disposition: A | Discharge status: Home / Self Care | Payer: BLUE CROSS/BLUE SHIELD | Source: Ambulatory Visit | Attending: Family Medicine | Admitting: Family Medicine

## 2018-10-11 DIAGNOSIS — Z1231 Encounter for screening mammogram for malignant neoplasm of breast: Secondary | ICD-10-CM

## 2018-11-03 ENCOUNTER — Telehealth: Payer: Self-pay

## 2018-11-03 NOTE — Telephone Encounter (Signed)
SENT REFERRAL TO SCHEDULING AND FILED NOTES 

## 2018-11-11 ENCOUNTER — Encounter: Payer: Self-pay | Admitting: Cardiology

## 2018-11-11 ENCOUNTER — Ambulatory Visit: Payer: BLUE CROSS/BLUE SHIELD | Admitting: Cardiology

## 2018-11-11 VITALS — BP 156/81 | HR 62 | Resp 16 | Ht 68.0 in | Wt 238.0 lb

## 2018-11-11 DIAGNOSIS — I10 Essential (primary) hypertension: Secondary | ICD-10-CM

## 2018-11-11 DIAGNOSIS — Z6836 Body mass index (BMI) 36.0-36.9, adult: Secondary | ICD-10-CM

## 2018-11-11 DIAGNOSIS — Z7189 Other specified counseling: Secondary | ICD-10-CM

## 2018-11-11 DIAGNOSIS — R079 Chest pain, unspecified: Secondary | ICD-10-CM

## 2018-11-11 NOTE — Progress Notes (Signed)
Cardiology Office Note:    Date:  11/11/2018   ID:  [name], DOB 07/18/1951, MRN [id number]  PCP:  Merri Brunette, MD  Cardiologist:  Jodelle Red, MD PhD  Referring MD: Laurann Montana, MD   Chief Complaint  Patient presents with  . New Patient (Initial Visit)    History of Present Illness:    [name] is a 67 y.o. female with a hx of hypertension, obesity, hyperlipidemia who is seen as a new consult at the request of Laurann Montana, MD for the evaluation and management of chest pain.  Chest pain: -Initial onset: early November 2019 -Quality: pressure in mid chest, radiates to back. Also has left arm tingling. -Frequency: has some amount of discomfort every day -Duration: can be all day, though fluctuates in intensity -Triggers: stress. Not related to food, position, exertion. -Aggravating/alleviating factors: better with acid reducers. Worse when she presses on it. Associated with continuous burping. -Prior cardiac history: none -Prior workup: lexiscan 2014 normal -Prior treatment: none -Alcohol: none -Tobacco: none -Comorbidities: no diabetes, has high blood pressure -Exercise level: has had knee replacement, avoids stairs but can do it if she needs to -Cardiac ROS: no shortness of breath, no PND, no orthopnea, no LE edema, no syncope -Family history: Mother had first MI in her early-40s, died at age 42 (had strokes later in life). Father had diabetes and cancer. Younger sister had HTN, died of cancer age 44. Older sister has had a stent, HTN, DM. Other sister has HTN.  Past Medical History:  Diagnosis Date  . Arthritis   . Asthma   . Claustrophobia   . Hypertension   . PONV (postoperative nausea and vomiting)   . Sleep apnea    HX of sleep apnea not currently using c pap    Past Surgical History:  Procedure Laterality Date  . ABDOMINAL HYSTERECTOMY    . JOINT REPLACEMENT     both knees  . LAPAROSCOPIC GASTRIC BAND REMOVAL WITH  LAPAROSCOPIC GASTRIC SLEEVE RESECTION N/A 02/24/2016   Procedure: LAPAROSCOPIC GASTRIC BAND REMOVAL WITH LAPAROSCOPIC GASTRIC SLEEVE RESECTION;  Surgeon: Luretha Murphy, MD;  Location: [facility];  Service: General;  Laterality: N/A;  . LAPAROSCOPIC GASTRIC BANDING    . LAPAROSCOPIC LYSIS OF ADHESIONS  02/24/2016   Procedure: LAPAROSCOPIC LYSIS OF ADHESIONS;  Surgeon: Luretha Murphy, MD;  Location: [facility];  Service: General;;  . UMBILICAL HERNIA REPAIR  02/24/2016   Procedure: HERNIA REPAIR UMBILICAL ADULT;  Surgeon: Luretha Murphy, MD;  Location: [facility];  Service: General;;    Current Medications: Current Outpatient Medications on File Prior to Visit  Medication Sig  . Eszopiclone 3 MG TABS   . hydrochlorothiazide (HYDRODIURIL) 25 MG tablet Take 25 mg by mouth every evening.   Marland Kitchen lisinopril (PRINIVIL,ZESTRIL) 20 MG tablet Take 20 mg by mouth daily.  . Multiple Vitamin (MULTIVITAMIN WITH MINERALS) TABS tablet Take 1 tablet by mouth daily.   No current facility-administered medications on file prior to visit.      Allergies:   Morphine and related and Lipitor [atorvastatin]   Social History   Socioeconomic History  . Marital status: Single    Spouse name: Not on file  . Number of children: Not on file  . Years of education: Not on file  . Highest education level: Not on file  Occupational History  . Not on file  Social Needs  . Financial resource strain: Not on file  . Food insecurity:    Worry: Not on file  Inability: Not on file  . Transportation needs:    Medical: Not on file    Non-medical: Not on file  Tobacco Use  . Smoking status: Never Smoker  . Smokeless tobacco: Never Used  Substance and Sexual Activity  . Alcohol use: No  . Drug use: No  . Sexual activity: Never  Lifestyle  . Physical activity:    Days per week: Not on file    Minutes per session: Not on file  . Stress: Not on file  Relationships  . Social connections:    Talks on phone: Not on file    Gets  together: Not on file    Attends religious service: Not on file    Active member of club or organization: Not on file    Attends meetings of clubs or organizations: Not on file    Relationship status: Not on file  Other Topics Concern  . Not on file  Social History Narrative  . Not on file   lives alone, has trouble cooking healthy for one person. Not as active as she used to be.   Family History: The patient's family history includes Breast cancer in her cousin, cousin, and maternal aunt; Cancer in her father and sister; Heart disease in her mother; Stroke in her mother. Mother had first MI in her early-40s, died at age 88 (had strokes later in life). Father had diabetes and cancer. Younger sister had HTN, died of cancer age 68. Older sister has had a stent, HTN, DM. Other sister has HTN.  ROS:   Please see the history of present illness.  Additional pertinent ROS:  Constitutional: Negative for chills, fever, night sweats, unintentional weight loss  HENT: Negative for ear pain and hearing loss.   Eyes: Negative for loss of vision and eye pain.  Respiratory: Negative for cough, sputum, shortness of breath, wheezing.   Cardiovascular: Positive for chest tightness. Negative for palpitations, PND, orthopnea, lower extremity edema and claudication.  Gastrointestinal: Negative for abdominal pain, melena, and hematochezia. Positive for belching. Genitourinary: Negative for dysuria and hematuria.  Musculoskeletal: Negative for falls and myalgias.  Skin: Negative for itching and rash.  Neurological: Negative for focal weakness, focal sensory changes and loss of consciousness.  Endo/Heme/Allergies: Does not bruise/bleed easily.    EKGs/Labs/Other Studies Reviewed:    The following studies were reviewed today: Lexiscan MPI 2014 IMPRESSION: No evidence for pharmacologically induced ischemia.  Normal wall motion and calculated ejection fraction is 74%.  EKG:  EKG is personally reviewed.   The ekg ordered today demonstrates normal sinus rhythm, left axis/IVCD  Recent Labs: No results found for requested labs within last 8760 hours.  Recent Lipid Panel    Component Value Date/Time   CHOL 204 (H) 01/09/2013 0950   TRIG 115 01/09/2013 0950   HDL 44 01/09/2013 0950   CHOLHDL 4.6 01/09/2013 0950   VLDL 23 01/09/2013 0950   LDLCALC 137 (H) 01/09/2013 0950    Physical Exam:    VS:  BP (!) 156/81   Pulse 62   Resp 16   Ht 5\' 8"  (1.727 m)   Wt 238 lb (108 kg)   SpO2 100%   BMI 36.19 kg/m     Wt Readings from Last 3 Encounters:  11/11/18 238 lb (108 kg)  09/21/17 220 lb 14.4 oz (100.2 kg)  08/04/17 214 lb 6.4 oz (97.3 kg)     GEN: Well nourished, well developed in no acute distress HEENT: Normal NECK: No JVD; No carotid bruits  LYMPHATICS: No lymphadenopathy CARDIAC: regular rhythm, normal S1 and S2, no murmurs, rubs, gallops. Radial and DP pulses 2+ bilaterally. RESPIRATORY:  Clear to auscultation without rales, wheezing or rhonchi  ABDOMEN: Soft, non-tender, non-distended MUSCULOSKELETAL:  No edema; No deformity  SKIN: Warm and dry NEUROLOGIC:  Alert and oriented x 3 PSYCHIATRIC:  Normal affect   ASSESSMENT:    1. Chest pain, unspecified type   2. Obesity, unspecified classification, unspecified obesity type, unspecified whether serious comorbidity present   3. Essential hypertension   4. Counseling on health promotion and disease prevention    PLAN:    1. Chest pain: has atypical components to it, such as modification with pressure and frequent belching. However, she does have a significant family history, and she has risk factors as well -discussed exercise treadmill test, she does think she can exercise. We discussed possible results of the test. If stress test abnormal, would pursue anatomical study such as CT coronary angiography -counseled on red flag warning signs  2. Prevention and health counseling -recommend heart healthy/Mediterranean diet,  with whole grains, fruits, vegetable, fish, lean meats, nuts, and olive oil. Limit salt. -recommend moderate walking, 3-5 times/week for 30-50 minutes each session. Aim for at least 150 minutes.week. Goal should be pace of 3 miles/hours, or walking 1.5 miles in 30 minutes -recommend avoidance of tobacco products. Avoid excess alcohol. -Additional risk factor control:  -Diabetes: A1c is not available recently, denies history of diabetes.  -Lipids: per records, Tchol 206, HDL 45, TG 89, LDL 143.  -Blood pressure control: essential hypertension, notes that she is nervous today. On HCTZ 25 mg and lisinopril 20 mg. If BP improved at PCP follow up, ok to continue, otherwise consider increasing lisinopril.  -Weight: Class 2 severe obesity, BMI 36. Has history of bariatric surgery. Wants to work on diet, exercise, lifestyle, stress management, weight loss. Will have her meet our heart guide team to see if they can help her.  -ASCVD risk score: 15.3% today, though improved BP control to goal would get her 8.4%.   Plan for follow up: 1 year or sooner based on results of testing.  Medication Adjustments/Labs and Tests Ordered: Current medicines are reviewed at length with the patient today.  Concerns regarding medicines are outlined above.  Orders Placed This Encounter  Procedures  . EXERCISE TOLERANCE TEST (ETT)  . EKG 12-Lead   No orders of the defined types were placed in this encounter.   Patient Instructions  Medication Instructions:  Your Physician recommend you continue on your current medication as directed.    If you need a refill on your cardiac medications before your next appointment, please call your pharmacy.   Lab work: None  Testing/Procedures: Your physician has requested that you have an exercise tolerance test. For further information please visit [email or web address]. Please also follow instruction sheet, as given. [address]   Follow-Up: At [facility], you and your health needs are our priority.  As part of our continuing mission to provide you with exceptional heart care, we have created designated Provider Care Teams.  These Care Teams include your primary Cardiologist (physician) and Advanced Practice Providers (APPs -  Physician Assistants and Nurse Practitioners) who all work together to provide you with the care you need, when you need it. You will need a follow up appointment in 1 years.  Please call our office 2 months in advance to schedule this appointment.  You may see Dr. Cristal Deerhristopher or one of the following  Advanced Practice Providers on your designated Care Team:   Theodore Demark, PA-C . Joni Reining, DNP, ANP  Any Other Special Instructions Will Be Listed Below (If Applicable).       Signed, Jodelle Red, MD PhD 11/11/2018 12:35 PM    [facility]

## 2018-11-11 NOTE — Patient Instructions (Signed)
Medication Instructions:  Your Physician recommend you continue on your current medication as directed.    If you need a refill on your cardiac medications before your next appointment, please call your pharmacy.   Lab work: None  Testing/Procedures: Your physician has requested that you have an exercise tolerance test. For further information please visit [email or web address]. Please also follow instruction sheet, as given. [address]   Follow-Up: At [facility], you and your health needs are our priority.  As part of our continuing mission to provide you with exceptional heart care, we have created designated Provider Care Teams.  These Care Teams include your primary Cardiologist (physician) and Advanced Practice Providers (APPs -  Physician Assistants and Nurse Practitioners) who all work together to provide you with the care you need, when you need it. You will need a follow up appointment in 1 years.  Please call our office 2 months in advance to schedule this appointment.  You may see Dr. Christopher or one of the following Advanced Practice Providers on your designated Care Team:   Rhonda Barrett, PA-C . Kathryn Lawrence, DNP, ANP  Any Other Special Instructions Will Be Listed Below (If Applicable).    

## 2018-11-15 ENCOUNTER — Ambulatory Visit (HOSPITAL_COMMUNITY)
Admission: RE | Admit: 2018-11-15 | Discharge: 2018-11-15 | Disposition: A | Discharge status: Home / Self Care | Payer: BLUE CROSS/BLUE SHIELD | Source: Ambulatory Visit | Attending: Internal Medicine | Admitting: Internal Medicine

## 2018-11-15 DIAGNOSIS — R079 Chest pain, unspecified: Secondary | ICD-10-CM | POA: Insufficient documentation

## 2018-11-15 LAB — EXERCISE TOLERANCE TEST
CHL CUP MPHR: 155 {beats}/min
CHL CUP RESTING HR STRESS: 57 {beats}/min
Estimated workload: 5.7 METS
Exercise duration (min): 3 min
Exercise duration (sec): 56 s
Peak HR: 142 {beats}/min
Percent HR: 91 %
RPE: 18

## 2018-12-01 ENCOUNTER — Ambulatory Visit: Payer: BLUE CROSS/BLUE SHIELD | Admitting: Cardiology

## 2018-12-04 IMAGING — MG DIGITAL SCREENING BILATERAL MAMMOGRAM WITH TOMO AND CAD
6 of 10 series · 6 of 30 positions shown · non-contrast
Comparison: Previous exam(s).

ACR Breast Density Category a: The breast tissue is almost entirely
fatty.

CLINICAL DATA: Screening.

EXAM:
DIGITAL SCREENING BILATERAL MAMMOGRAM WITH TOMO AND CAD

[L CC synth-2D]
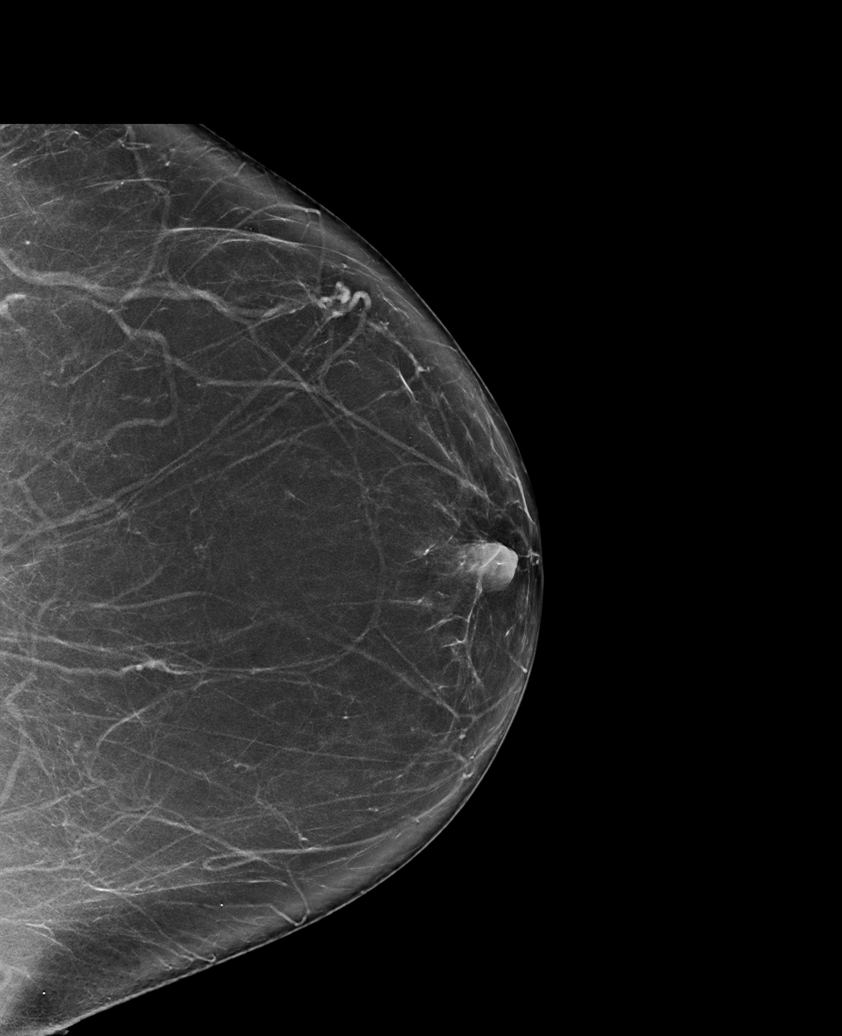

[L MLO synth-2D (1 of 2)]
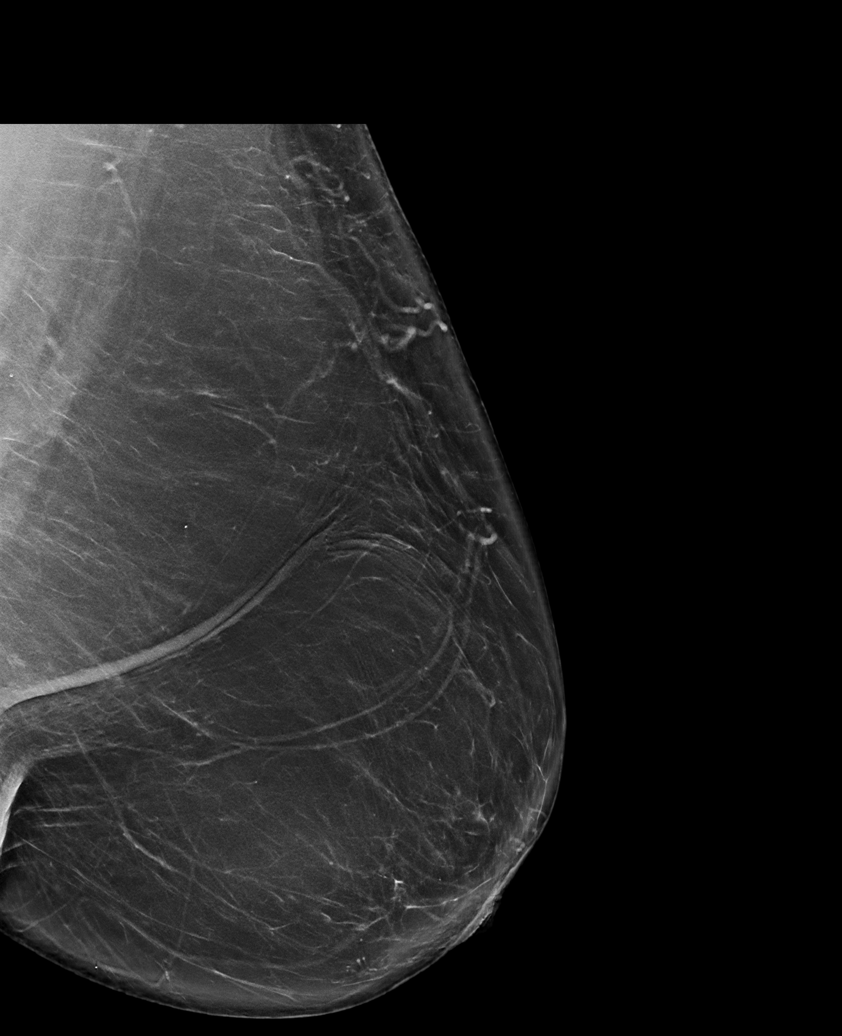

[R MLO synth-2D]
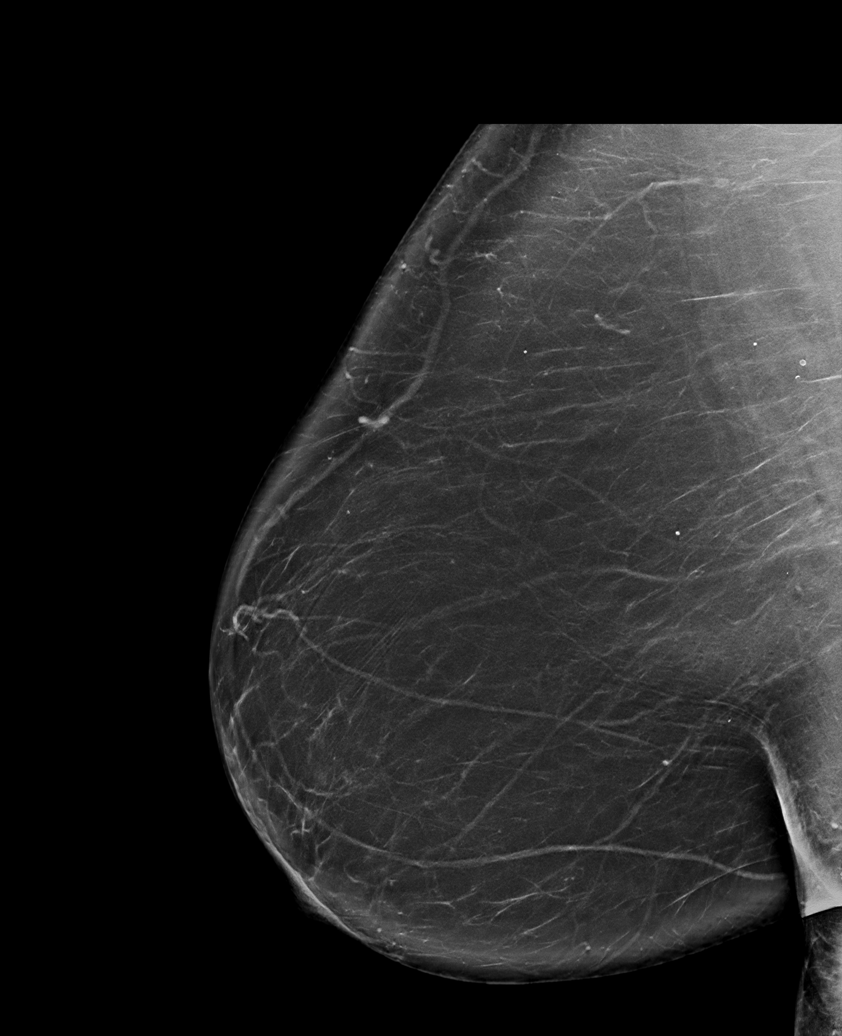

[L MLO synth-2D (2 of 2)]
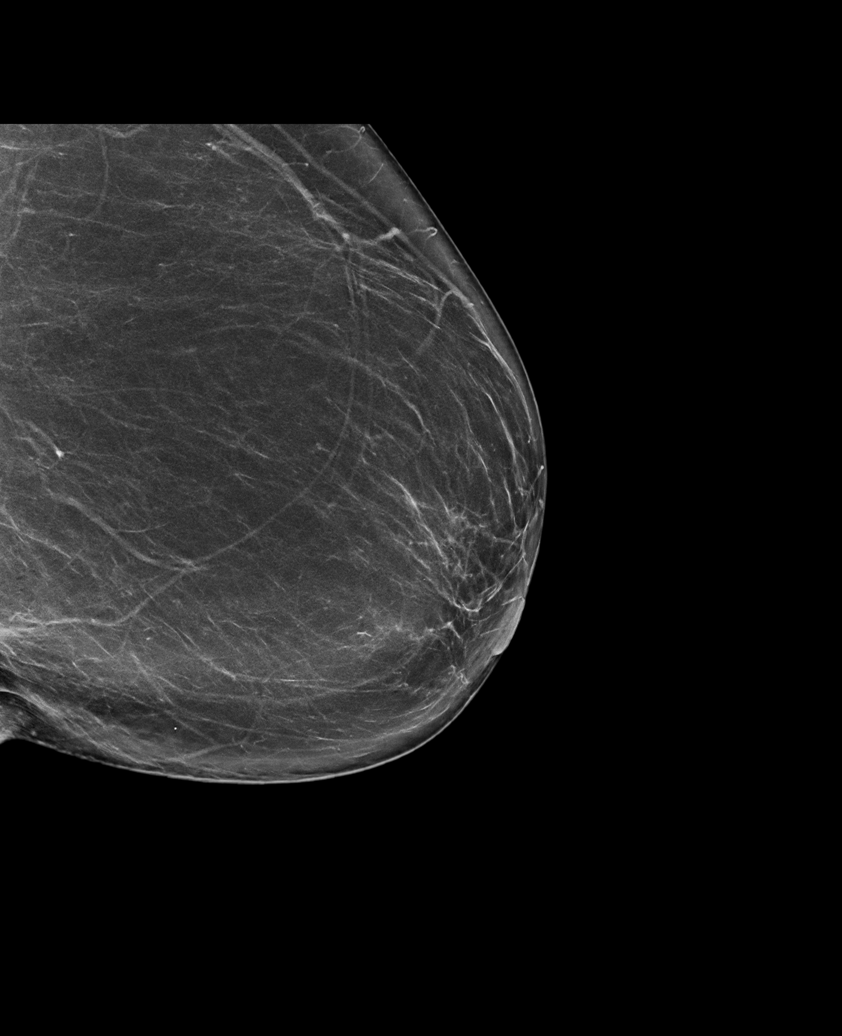

[R CC synth-2D]
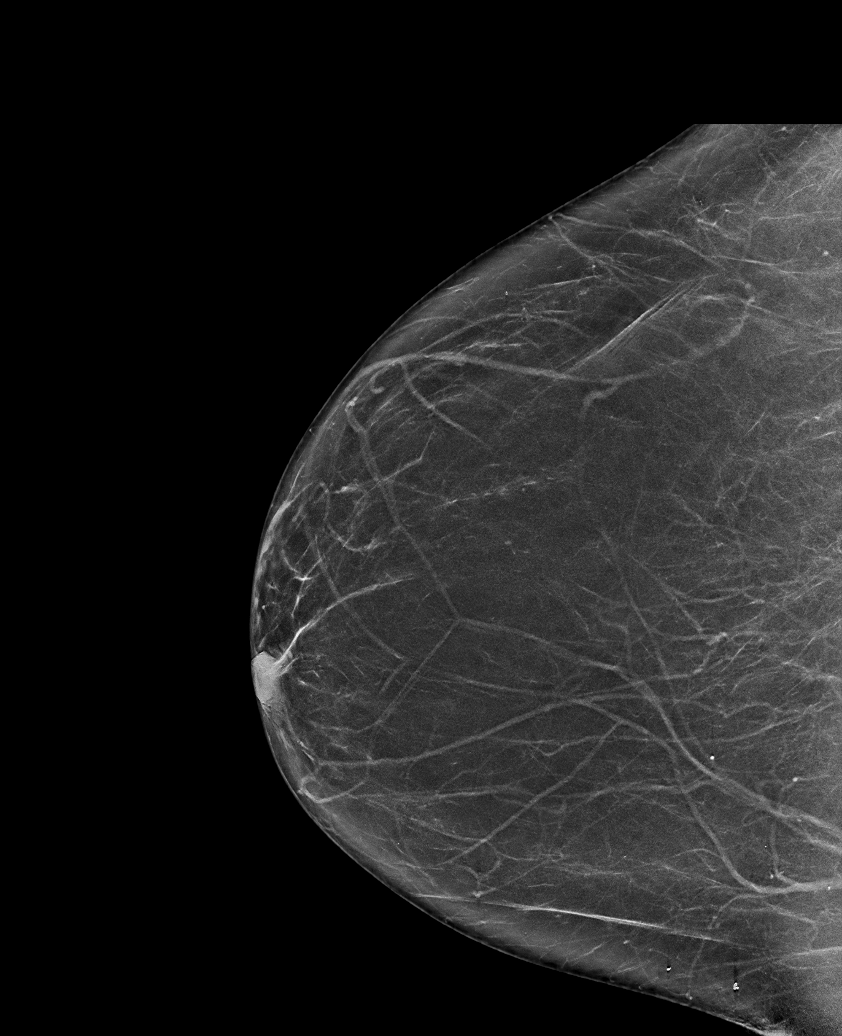

[L MLO tomo · tomo slice 43/84.0]
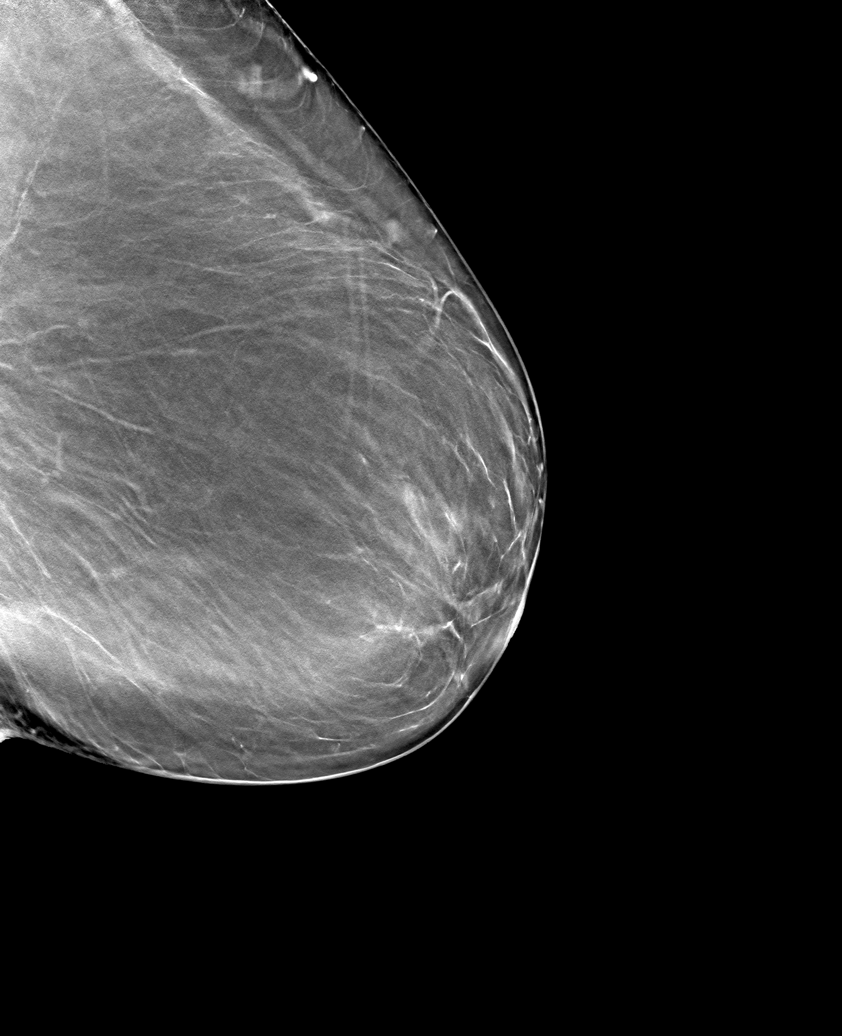

[6 of 30 positions shown; findings below may reference images not displayed]

FINDINGS: There are no findings suspicious for malignancy. Images were
processed with CAD.
IMPRESSION: No mammographic evidence of malignancy. A result letter of this
screening mammogram will be mailed directly to the patient.

RECOMMENDATION:
Screening mammogram in one year. (Code:[id number]-[id number])

BI-RADS CATEGORY  1: Negative.

## 2019-12-26 ENCOUNTER — Telehealth: Payer: Self-pay | Admitting: *Deleted

## 2019-12-26 NOTE — Telephone Encounter (Signed)
A message was left, re: her follow up visit.
